# Patient Record
Sex: Male | Born: 1940 | Race: White | Hispanic: No | Marital: Married | Smoking: Former smoker
Health system: Southern US, Community
[De-identification: ages and names within clinical notes are randomized; demographics above are authoritative.]

## PROBLEM LIST (undated history)

## (undated) DIAGNOSIS — E785 Hyperlipidemia, unspecified: Secondary | ICD-10-CM

## (undated) DIAGNOSIS — M4800 Spinal stenosis, site unspecified: Secondary | ICD-10-CM

## (undated) DIAGNOSIS — I480 Paroxysmal atrial fibrillation: Secondary | ICD-10-CM

## (undated) DIAGNOSIS — N184 Chronic kidney disease, stage 4 (severe): Secondary | ICD-10-CM

## (undated) DIAGNOSIS — R7989 Other specified abnormal findings of blood chemistry: Secondary | ICD-10-CM

## (undated) DIAGNOSIS — I639 Cerebral infarction, unspecified: Secondary | ICD-10-CM

## (undated) DIAGNOSIS — Z7901 Long term (current) use of anticoagulants: Secondary | ICD-10-CM

## (undated) DIAGNOSIS — I10 Essential (primary) hypertension: Secondary | ICD-10-CM

## (undated) DIAGNOSIS — I2510 Atherosclerotic heart disease of native coronary artery without angina pectoris: Secondary | ICD-10-CM

## (undated) DIAGNOSIS — E7800 Pure hypercholesterolemia, unspecified: Secondary | ICD-10-CM

## (undated) HISTORY — DX: Hyperlipidemia, unspecified: E78.5

## (undated) HISTORY — PX: CHOLECYSTECTOMY: SHX55

## (undated) HISTORY — DX: Long term (current) use of anticoagulants: Z79.01

## (undated) HISTORY — PX: CATARACT EXTRACTION: SUR2

## (undated) HISTORY — DX: Pure hypercholesterolemia, unspecified: E78.00

## (undated) HISTORY — DX: Atherosclerotic heart disease of native coronary artery without angina pectoris: I25.10

## (undated) HISTORY — DX: Essential (primary) hypertension: I10

## (undated) HISTORY — DX: Other specified abnormal findings of blood chemistry: R79.89

## (undated) HISTORY — DX: Spinal stenosis, site unspecified: M48.00

## (undated) HISTORY — DX: Cerebral infarction, unspecified: I63.9

## (undated) HISTORY — DX: Chronic kidney disease, stage 4 (severe): N18.4

## (undated) HISTORY — DX: Paroxysmal atrial fibrillation: I48.0

## (undated) HISTORY — PX: BACK SURGERY: SHX140

---

## 1997-11-12 ENCOUNTER — Inpatient Hospital Stay (HOSPITAL_COMMUNITY): Admission: EM | Admit: 1997-11-12 | Discharge: 1997-11-20 | Payer: Self-pay | Admitting: Emergency Medicine

## 1998-01-21 ENCOUNTER — Ambulatory Visit (HOSPITAL_COMMUNITY): Admission: RE | Admit: 1998-01-21 | Discharge: 1998-01-21 | Payer: Self-pay | Admitting: Surgery

## 2005-09-14 ENCOUNTER — Encounter: Payer: Self-pay | Admitting: Surgery

## 2007-12-10 ENCOUNTER — Inpatient Hospital Stay (HOSPITAL_COMMUNITY): Admission: AD | Admit: 2007-12-10 | Discharge: 2007-12-13 | Payer: Self-pay | Admitting: Family Medicine

## 2007-12-11 ENCOUNTER — Ambulatory Visit: Payer: Self-pay | Admitting: Vascular Surgery

## 2007-12-11 ENCOUNTER — Encounter (INDEPENDENT_AMBULATORY_CARE_PROVIDER_SITE_OTHER): Payer: Self-pay | Admitting: Family Medicine

## 2007-12-12 ENCOUNTER — Ambulatory Visit: Payer: Self-pay | Admitting: Physical Medicine & Rehabilitation

## 2007-12-13 ENCOUNTER — Ambulatory Visit: Payer: Self-pay | Admitting: Oncology

## 2007-12-13 ENCOUNTER — Ambulatory Visit: Payer: Self-pay | Admitting: Physical Medicine & Rehabilitation

## 2007-12-17 ENCOUNTER — Inpatient Hospital Stay (HOSPITAL_COMMUNITY)
Admission: RE | Admit: 2007-12-17 | Discharge: 2007-12-21 | Payer: Self-pay | Admitting: Physical Medicine & Rehabilitation

## 2007-12-21 ENCOUNTER — Ambulatory Visit: Payer: Self-pay | Admitting: Hematology

## 2007-12-22 ENCOUNTER — Emergency Department (HOSPITAL_COMMUNITY): Admission: EM | Admit: 2007-12-22 | Discharge: 2007-12-22 | Payer: Self-pay | Admitting: Emergency Medicine

## 2007-12-25 ENCOUNTER — Encounter
Admission: RE | Admit: 2007-12-25 | Discharge: 2008-03-24 | Payer: Self-pay | Admitting: Physical Medicine & Rehabilitation

## 2008-02-22 ENCOUNTER — Encounter
Admission: RE | Admit: 2008-02-22 | Discharge: 2008-05-15 | Payer: Self-pay | Admitting: Physical Medicine & Rehabilitation

## 2008-02-25 ENCOUNTER — Ambulatory Visit: Payer: Self-pay | Admitting: Physical Medicine & Rehabilitation

## 2008-03-25 ENCOUNTER — Encounter
Admission: RE | Admit: 2008-03-25 | Discharge: 2008-04-28 | Payer: Self-pay | Admitting: Physical Medicine & Rehabilitation

## 2008-04-29 ENCOUNTER — Encounter
Admission: RE | Admit: 2008-04-29 | Discharge: 2008-05-15 | Payer: Self-pay | Admitting: Physical Medicine & Rehabilitation

## 2009-03-09 ENCOUNTER — Encounter: Admission: RE | Admit: 2009-03-09 | Discharge: 2009-03-09 | Payer: Self-pay | Admitting: Family Medicine

## 2009-07-01 ENCOUNTER — Observation Stay (HOSPITAL_COMMUNITY): Admission: RE | Admit: 2009-07-01 | Discharge: 2009-07-04 | Payer: Self-pay | Admitting: Specialist

## 2009-12-16 ENCOUNTER — Ambulatory Visit: Payer: Self-pay | Admitting: Cardiology

## 2009-12-30 ENCOUNTER — Ambulatory Visit: Payer: Self-pay | Admitting: Cardiology

## 2010-01-27 ENCOUNTER — Ambulatory Visit: Payer: Self-pay | Admitting: Cardiology

## 2010-02-17 ENCOUNTER — Ambulatory Visit: Payer: Self-pay | Admitting: Cardiology

## 2010-08-05 LAB — BASIC METABOLIC PANEL
BUN: 19 mg/dL (ref 6–23)
BUN: 20 mg/dL (ref 6–23)
BUN: 20 mg/dL (ref 6–23)
CO2: 23 mEq/L (ref 19–32)
Calcium: 8.1 mg/dL — ABNORMAL LOW (ref 8.4–10.5)
Chloride: 102 mEq/L (ref 96–112)
Chloride: 105 mEq/L (ref 96–112)
Chloride: 107 mEq/L (ref 96–112)
GFR calc Af Amer: 54 mL/min — ABNORMAL LOW (ref >60)
GFR calc non Af Amer: 43 mL/min — ABNORMAL LOW (ref >60)
GFR calc non Af Amer: 45 mL/min — ABNORMAL LOW (ref >60)
Potassium: 4.1 mEq/L (ref 3.5–5.1)
Potassium: 4.2 mEq/L (ref 3.5–5.1)
Sodium: 137 mEq/L (ref 135–145)
Sodium: 137 mEq/L (ref 135–145)
Sodium: 138 mEq/L (ref 135–145)

## 2010-08-05 LAB — COMPREHENSIVE METABOLIC PANEL
ALT: 17 U/L (ref 0–53)
AST: 18 U/L (ref 0–37)
Albumin: 3.6 g/dL (ref 3.5–5.2)
Albumin: 3.8 g/dL (ref 3.5–5.2)
BUN: 20 mg/dL (ref 6–23)
CO2: 25 mEq/L (ref 19–32)
Calcium: 8.8 mg/dL (ref 8.4–10.5)
Chloride: 106 mEq/L (ref 96–112)
Creatinine, Ser: 1.64 mg/dL — ABNORMAL HIGH (ref 0.4–1.5)
GFR calc Af Amer: 51 mL/min — ABNORMAL LOW (ref >60)
GFR calc non Af Amer: 42 mL/min — ABNORMAL LOW (ref >60)
Glucose, Bld: 89 mg/dL (ref 70–99)
Sodium: 141 mEq/L (ref 135–145)
Total Bilirubin: 1.3 mg/dL — ABNORMAL HIGH (ref 0.3–1.2)
Total Bilirubin: 1.3 mg/dL — ABNORMAL HIGH (ref 0.3–1.2)
Total Protein: 6.7 g/dL (ref 6.0–8.3)
Total Protein: 6.9 g/dL (ref 6.0–8.3)

## 2010-08-05 LAB — URINALYSIS, ROUTINE W REFLEX MICROSCOPIC
Glucose, UA: NEGATIVE mg/dL
Specific Gravity, Urine: 1.016 (ref 1.005–1.030)
pH: 6.5 (ref 5.0–8.0)

## 2010-08-05 LAB — CBC
HCT: 33.8 % — ABNORMAL LOW (ref 39.0–52.0)
HCT: 35.3 % — ABNORMAL LOW (ref 39.0–52.0)
Hemoglobin: 11.8 g/dL — ABNORMAL LOW (ref 13.0–17.0)
Hemoglobin: 13.1 g/dL (ref 13.0–17.0)
Hemoglobin: 16.2 g/dL (ref 13.0–17.0)
MCHC: 34 g/dL (ref 30.0–36.0)
MCHC: 34.7 g/dL (ref 30.0–36.0)
MCV: 94.6 fL (ref 78.0–100.0)
MCV: 95 fL (ref 78.0–100.0)
MCV: 95.1 fL (ref 78.0–100.0)
Platelets: 151 10*3/uL (ref 150–400)
RBC: 3.71 MIL/uL — ABNORMAL LOW (ref 4.22–5.81)
RBC: 3.98 MIL/uL — ABNORMAL LOW (ref 4.22–5.81)
RBC: 5.03 MIL/uL (ref 4.22–5.81)
RDW: 13.1 % (ref 11.5–15.5)
RDW: 13.3 % (ref 11.5–15.5)
RDW: 13.4 % (ref 11.5–15.5)
RDW: 13.5 % (ref 11.5–15.5)
WBC: 10.3 10*3/uL (ref 4.0–10.5)
WBC: 10.4 10*3/uL (ref 4.0–10.5)
WBC: 12.9 10*3/uL — ABNORMAL HIGH (ref 4.0–10.5)

## 2010-08-05 LAB — PROTIME-INR
INR: 1.24 (ref 0.00–1.49)
Prothrombin Time: 15.9 seconds — ABNORMAL HIGH (ref 11.6–15.2)

## 2010-08-13 ENCOUNTER — Ambulatory Visit: Payer: Self-pay | Admitting: Cardiology

## 2010-08-18 ENCOUNTER — Encounter: Payer: Self-pay | Admitting: *Deleted

## 2010-08-19 ENCOUNTER — Ambulatory Visit (INDEPENDENT_AMBULATORY_CARE_PROVIDER_SITE_OTHER): Payer: Medicare Other | Admitting: Nurse Practitioner

## 2010-08-19 ENCOUNTER — Encounter: Payer: Self-pay | Admitting: Nurse Practitioner

## 2010-08-19 VITALS — BP 150/100 | HR 56 | Ht 69.0 in | Wt 208.2 lb

## 2010-08-19 DIAGNOSIS — E785 Hyperlipidemia, unspecified: Secondary | ICD-10-CM | POA: Insufficient documentation

## 2010-08-19 DIAGNOSIS — Z7901 Long term (current) use of anticoagulants: Secondary | ICD-10-CM | POA: Insufficient documentation

## 2010-08-19 DIAGNOSIS — I4891 Unspecified atrial fibrillation: Secondary | ICD-10-CM

## 2010-08-19 DIAGNOSIS — I10 Essential (primary) hypertension: Secondary | ICD-10-CM | POA: Insufficient documentation

## 2010-08-19 DIAGNOSIS — I480 Paroxysmal atrial fibrillation: Secondary | ICD-10-CM | POA: Insufficient documentation

## 2010-08-19 MED ORDER — AMLODIPINE BESYLATE 10 MG PO TABS: 10.0000 mg | ORAL_TABLET | Freq: Every day | ORAL | Status: DC

## 2010-08-19 NOTE — Progress Notes (Signed)
History of Present Illness: [name] is seen today for a follow up visit. He is seen for Dr. Martinique. It is his 6 month check. Overall, he has been doing ok. He tries to stay active. He checks his blood pressure at home. It has been running in the [id number] to [id number] systolic range at home. He denies chest pain, shortness of breath, dizziness or palpitations. He is tolerating his medicines. Recent labs are reviewed. Blood pressure is up here today. He has already had his medicines today.  Current Outpatient Prescriptions on File Prior to Visit  Medication Sig Dispense Refill  . Ascorbic Acid (VITAMIN C) 500 MG tablet Take 1 tablet (500 mg total) by mouth daily.  30 tablet    . Coenzyme Q10 (COQ10) 200 MG CAPS Take by mouth daily.      . dabigatran (PRADAXA) 150 MG CAPS Take 150 mg by mouth every 12 (twelve) hours.        Marland Kitchen FA-Pyridoxine-Cyancobalamin (FOLTX PO) Take by mouth daily.        . hydrochlorothiazide (,MICROZIDE/HYDRODIURIL,) 12.5 MG capsule Take 1 capsule (12.5 mg total) by mouth daily.  30 capsule    . lisinopril (PRINIVIL,ZESTRIL) 20 MG tablet Take 20 mg by mouth daily.        . simvastatin (ZOCOR) 20 MG tablet Take 20 mg by mouth at bedtime.        Marland Kitchen DISCONTD: amLODipine (NORVASC) 5 MG tablet Take 5 mg by mouth daily.        Marland Kitchen aspirin 81 MG tablet Take 81 mg by mouth daily.          No Known Allergies  Past Medical History  Diagnosis Date  . Arrhythmia     AFIB  . Hypertension   . Stroke   . Anticoagulated on warfarin   . Hyperlipidemia     No past surgical history on file.  History  Smoking status  . Former Smoker  . Quit date: 05/16/1968  Smokeless tobacco  . Not on file    History  Alcohol Use No    Family History  Problem Relation Age of Onset  . Hypertension Mother   . Heart disease Father     Review of Systems: The review of systems is positive for chronic tremor of the left hand. He denies any stroke symptoms. He does have some arthritic complaint.  All  other systems were reviewed and are negative.  Physical Exam: BP 150/100  Pulse 56  Ht 5\' 9"  (1.753 m)  Wt 208 lb 3.2 oz (94.439 kg)  BMI 30.75 kg/m2 Patient is very pleasant and in no acute distress. Skin is warm and dry. Color is normal.  HEENT is unremarkable. Normocephalic/atraumatic. PERRL. Sclera are nonicteric. Neck is supple. No masses. No JVD. Lungs are clear. Cardiac exam shows an irregular irregular rhythm. Abdomen is soft. Extremities are without edema. Gait and ROM are intact. No gross neurologic deficits noted. He does have a tremor of the left hand noted.   LABORATORY DATA:   BUN was 27 and creatinine 1.6. His total cholesterol  Is 95, LDL is 50, HDL was 34, TG 57. Blood sugar, LFTs, and CBC is normal.  Assessment / Plan:

## 2010-08-19 NOTE — Patient Instructions (Signed)
Monitor your blood pressure at home.  Record your readings and bring to your next visit. Limit sodium intake. We are going to increase the amlodipine to 10mg  each day. Take two of your 5 mg tablets to equal this dose. I have sent the prescription for the 10mg  to the drug store. I will see you in one month.

## 2010-08-19 NOTE — Assessment & Plan Note (Signed)
Rate is controlled. He is on Pradaxa.

## 2010-08-19 NOTE — Assessment & Plan Note (Signed)
On low dose Zocor. Lipids are satisfactory. He will need to have labs rechecked in about 6 months.

## 2010-08-19 NOTE — Assessment & Plan Note (Signed)
Blood pressure is elevated and here and at home. I am going to increase his Norvasc to 10mg . He is instructed to watch for swelling. I will see him back in one month. He is to continue to monitor at home.

## 2010-08-26 ENCOUNTER — Other Ambulatory Visit: Payer: Self-pay | Admitting: Cardiology

## 2010-08-26 DIAGNOSIS — I10 Essential (primary) hypertension: Secondary | ICD-10-CM

## 2010-08-26 MED ORDER — AMLODIPINE BESYLATE 10 MG PO TABS: 10.0000 mg | ORAL_TABLET | Freq: Every day | ORAL | Status: DC

## 2010-08-26 MED ORDER — HYDROCHLOROTHIAZIDE 12.5 MG PO CAPS: 12.5000 mg | ORAL_CAPSULE | Freq: Every day | ORAL | Status: DC

## 2010-08-26 MED ORDER — LISINOPRIL 20 MG PO TABS: 20.0000 mg | ORAL_TABLET | Freq: Every day | ORAL | Status: DC

## 2010-08-26 NOTE — Telephone Encounter (Signed)
Needs his blood pressure medication (Lisinopril, Amlodipinebesylate) refilled and needs a medication update. [facility] [place]. Please call back. I have pulled the chart.

## 2010-08-26 NOTE — Telephone Encounter (Signed)
Wife called requesting refill on 3 meds;sent to [facility]

## 2010-09-17 ENCOUNTER — Ambulatory Visit (INDEPENDENT_AMBULATORY_CARE_PROVIDER_SITE_OTHER): Payer: Medicare Other | Admitting: Nurse Practitioner

## 2010-09-17 ENCOUNTER — Encounter: Payer: Self-pay | Admitting: Nurse Practitioner

## 2010-09-17 VITALS — BP 126/80 | HR 58 | Ht 70.0 in | Wt 210.0 lb

## 2010-09-17 DIAGNOSIS — I10 Essential (primary) hypertension: Secondary | ICD-10-CM

## 2010-09-17 DIAGNOSIS — Z7901 Long term (current) use of anticoagulants: Secondary | ICD-10-CM

## 2010-09-17 DIAGNOSIS — I4891 Unspecified atrial fibrillation: Secondary | ICD-10-CM

## 2010-09-17 DIAGNOSIS — I480 Paroxysmal atrial fibrillation: Secondary | ICD-10-CM

## 2010-09-17 MED ORDER — DABIGATRAN ETEXILATE MESYLATE 150 MG PO CAPS: 150.0000 mg | ORAL_CAPSULE | Freq: Two times a day (BID) | ORAL | Status: DC

## 2010-09-17 NOTE — Patient Instructions (Signed)
Stay on your current medicines. Continue to watch your salt.  If your blood pressure stays below 120 on the top, you can try cutting back the Norvasc to just a half a tablet again. We will see you back in about 6 months. Call for any problems.

## 2010-09-17 NOTE — Assessment & Plan Note (Signed)
He is in atrial fib by exam today. He is asymptomatic. He remains on anticoagulation.

## 2010-09-17 NOTE — Progress Notes (Signed)
   Antonio [name] Date of Birth: [birth year]   History of Present Illness: Antonio [name] is seen back today for a one month visit. He is seen for Dr. Martinique. He is on higher doses of Norvasc for his blood pressure. He is back checking his blood pressure and it is better at home. His readings from home are reviewed. He had had a higher intake of salt on the day of his last visit and he is trying to do better. He does have some occasional edema of his ankles but he says it is not bothersome. He has no chest pain. He has no complaint.  Current Outpatient Prescriptions on File Prior to Visit  Medication Sig Dispense Refill  . amLODipine (NORVASC) 10 MG tablet Take 1 tablet (10 mg total) by mouth daily.  30 tablet  11  . Ascorbic Acid (VITAMIN C) 500 MG tablet Take 1 tablet (500 mg total) by mouth daily.  30 tablet    . Coenzyme Q10 (COQ10) 200 MG CAPS Take by mouth daily.      . dabigatran (PRADAXA) 150 MG CAPS Take 150 mg by mouth every 12 (twelve) hours.        Marland Kitchen FA-Pyridoxine-Cyancobalamin (FOLTX PO) Take by mouth daily.        . hydrochlorothiazide (,MICROZIDE/HYDRODIURIL,) 12.5 MG capsule Take 1 capsule (12.5 mg total) by mouth daily.  30 capsule  5  . lisinopril (PRINIVIL,ZESTRIL) 20 MG tablet Take 1 tablet (20 mg total) by mouth daily.  30 tablet  5  . simvastatin (ZOCOR) 20 MG tablet Take 20 mg by mouth at bedtime.        Marland Kitchen aspirin 81 MG tablet Take 81 mg by mouth daily.          No Known Allergies  Past Medical History  Diagnosis Date  . Hypertension   . Stroke   . Anticoagulated on warfarin     Replaced with Pradaxa  . Hyperlipidemia   . Chronic anticoagulation   . CRI (chronic renal insufficiency)   . PAF (paroxysmal atrial fibrillation)     Past Surgical History  Procedure Date  . Cholecystectomy   . Cataract extraction   . Back surgery     History  Smoking status  . Former Smoker  . Quit date: 05/16/1968  Smokeless tobacco  . Not on file    History  Alcohol Use  No    Family History  Problem Relation Age of Onset  . Hypertension Mother   . Stroke Mother   . Heart disease Father   . Diabetes Father   . Diabetes Brother   . Diabetes Brother     Review of Systems: The review of systems is positive for occasional edema of the ankles since starting the higher dose of Norvas.  All other systems were reviewed and are negative.  Physical Exam: BP 126/80  Pulse 58  Ht 5\' 10"  (1.778 m)  Wt 210 lb (95.255 kg)  BMI 30.13 kg/m2 Patient is very pleasant and in no acute distress. Skin is warm and dry. Color is normal.  HEENT is unremarkable. Normocephalic/atraumatic. PERRL. Sclera are nonicteric. Neck is supple. No masses. No JVD. Lungs are clear. Cardiac exam shows an irregular rhythm today. Abdomen is soft. Extremities are with just trace edema. Gait and ROM are intact. No gross neurologic deficits noted.  LABORATORY DATA:   Assessment / Plan:

## 2010-09-17 NOTE — Assessment & Plan Note (Signed)
He remains on Pradaxa. He is not having any adverse effects. Prescription was refilled today. Samples were also given.

## 2010-09-17 NOTE — Assessment & Plan Note (Signed)
Blood pressure is better. He seems committed to watching his salt. He will continue to monitor his blood pressure at home. I will let him cut back on the Norvasc back to 5mg  if his systolic readings stay below 120 and he can watch his salt. Patient is agreeable to this plan and will call if any problems develop in the interim.

## 2010-09-28 NOTE — Consult Note (Signed)
NAME:  Antonio [name], Antonio [name] NO.:  [id number]   MEDICAL RECORD NO.:  [id number]          PATIENT TYPE:  IPS   LOCATION:  [id number]                         FACILITY:  [facility]   PHYSICIAN:  Sherryl Manges, MD            DATE OF BIRTH:  07/31/1940   DATE OF CONSULTATION:  12/17/2007  DATE OF DISCHARGE:                                 CONSULTATION   REASON FOR CONSULTATION:  Rule out polycythemia vera as a result of his  CVA.   HISTORY OF PRESENT ILLNESS:  Antonio [name] is a 70 year old right-handed  man with history of hypertension, who was admitted on December 10, 2007, for  newly diagnosed CVA.  He had a history of hypertension for many years,  however, he never had a history of stroke.  On the day of admission, he  was noted to have right sided weakness in his right arm and leg in  addition to clumsiness.  Of note, he is a right-handed gentleman.  Eventually, he developed expressive aphasia.  He presented to his PCP's  office, where he was noted to have pressure of 210/110, with atrial  fibrillation.  He was eventually admitted to the hospital where an MRI  of the brain and MRA was performed on December 11, 2007, which showed acute  infarct in the posterior left corona radiata extending to the posterior  limb of the internal capsule.  There was no mass effect or hemorrhage.  There was superimposed advanced chronic small vessel ischemic disease.  The MRA showed intracranial atherosclerotic disease, worse involving the  left supraclinoid ICA, left MCA, left vertebral artery and right PCA  without high grade stenosis or branch occlusions identified.  He was  discharged to inpatient rehab and has remarkably regained his right  sided strength in addition to his aphasia.  The patient was visited  today for the today for the time in the presence of his wife and his  daughter.  He denied fever, chills, headache, visual change,  erythromelalgia, neuropathy, nodal swelling, chest pain, shortness of  breath, calf swelling, calf pain, bone pain, abdominal swelling or  breathing symptoms.   The rest of the 14 point review of systems was negative.   PAST MEDICAL HISTORY:  1. Hypertension.  2. The patient denied a history of COPD, however, he does have an      extensive history of second-hand smoke.   CURRENT MEDICATIONS:  Lisinopril, Zocor, vitamin B12, folate, Coumadin,  hydrochlorothiazide, aspirin, Norvasc, Catapres, Dulcolax, senna,  trazodone, Tylenol p.r.n., Phenergan.   ALLERGIES:  NO KNOWN DRUG ALLERGIES.   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  He did have a personal  history of smoking, however, he had quit 40 years ago.  He denies  history of alcohol or IV drug use.  He lives with his wife.   FAMILY HISTORY:  His father is alive at age 60.  However, the father did  have history of prostate, bladder and colon cancer and apparently is  in  remission from these cancers.  His mom has hypertension.  He has two  brothers and one sister.  They have diabetes.  One of the brothers has  prostate cancer.   PHYSICAL EXAMINATION:  VITAL SIGNS:  Temperature 97.3 Fahrenheit, heart  rate 61, respiratory rate 18, blood pressure 131/70, 95% on room air.  GENERAL:  No acute distress.  Alert and oriented x4.  HEENT:  Oropharynx was clear without mucosal lesion.  LYMPHATIC:  Without cervical or axillary lymphadenopathy.  LUNGS:  Clear bilaterally without wheezes or crackles.  CARDIOVASCULAR:  Irregularly irregular.  S1-S2 without murmur, rub or  gallop.  ABDOMEN:  Soft, flat, nontender.  Nondistended without fluid wave or  organomegaly.  EXTREMITIES:  There was no pedal edema.  NEURO:  II-XII grossly intact, except for tongue deviated to the left.  There was a right pronator drift.  Motor strength is 5/5 on the left  side, however, 4+/5 on the right side, both upper and lower extremities.  There was no  dysmetria.  There was hyperreflexia on the right side.   LABORATORY DATA:  WBC 8.3, hemoglobin 18.1, MCV 92, platelet count 187.  Potassium 3.3, BUN 24, creatinine 1.37, AST 23, ALT 19, alkaline  phosphatase 80, T bili 0.9, albumin 3.4, INR 2.2.  Vitamin B12  182  picogram per mL.  TSH  3.6.  Homocysteine level  21.4.   ASSESSMENT/PLAN:  It is my impression that Antonio [name] is a 70 year old  man with history of hypertension, who was admitted for right sided  weakness with MRI showing a left sided cerebrovascular accident, who was  incidentally found to have an elevated hemoglobin.   For his elevated hemoglobin, need to determine that this is truly  polycythemia with nuclear study red cell mass.  Since the patient is on  hydrochlorothiazide, in addition to second-hand smoke, his elevated  hemoglobin can be secondary to diuretics and elevated carboxyhemoglobin.  Would also recommend arterial blood gas for carboxy-hemoglobin level.  In addition, will recommend testing his serum level for iron study and  erythropoietin in addition to methylmalonic acid to truly determine that  he does has vitamin B12 deficiency.  Will also send his peripheral blood  for JAK-2 gene mutation as 90 % of patients with polycythemia vera is  positive for this mutation.   Recommend following the patient's potassium level as vitamin B12  replacement in patients with B12 deficiency can result in hypokalemia.  Otherwise, for his present CVA, will recommend continuing with aspirin,  Coumadin, and blood pressure control as you are right now.   Once all these tests are resulted, I will discuss with the patient the  final recommendation and treatment plan.  If patient is discharge prior  to the final result of these tests, please have patient follow up with  me in [facility] upon discharge.      Sherryl Manges, MD  Electronically Signed     HH/MEDQ  D:  12/17/2007  T:  12/17/2007  Job:  [id number]

## 2010-09-28 NOTE — Assessment & Plan Note (Signed)
[name] returns to clinic today accompanied by his wife and his  daughter.  The patient is a 70 year old male with a history of gallstone  pancreatitis in the past.  Otherwise, he was in relatively good health.  He was admitted from his primary care physician's office on December 10, 2007, with elevated blood pressure of 210/110 with atrial fibrillation  as well as right-sided weakness and slurred speech.  MRI and MRA studies  of the brain showed an acute left corona radiata to the posterior limb  and internal capsule infarct with advanced small vessel disease along  with intracranial stenosis without high-grade stenosis.  A 2D  echocardiogram showed an ejection fraction of 60-75% with mild-to-  moderate increase in left ventricular wall size.  Carotid Doppler showed  no significant internal carotid artery stenosis.  He was maintained on  Coumadin and aspirin at the time of transfer to the rehabilitation unit  on December 13, 2007.  While in the rehabilitation unit, the patient made  excellent progress overall.  He was eventually discharged home on December 21, 2007, and has been attending outpatient at physical therapy and  occupational therapy at the [facility] address.  Occupational therapy  is finished and he is independent with bathing and dressing.  He is  using a cane for longer distance mobility and physical therapy plans to  see him in another month or so.  He continues to see Dr. Verlon Setting, his  primary care physician.  The patient reports that he can walk  approximately 200 yards without any device at present.   MEDICATIONS:  1. Lisinopril 20 mg.  2. Hydrochlorothiazide 25 mg.  3. Foltx 1 tablet b.i.d.  4. Potassium chloride 20 mEq 2 tablets daily.  5. Simvastatin 20 mg.  6. Warfarin 2 mg daily except one-half tablet every Tuesday and      Thursday.  7. Ecotrin 81 mg daily.   REVIEW OF SYSTEMS:  Noncontributory.   PHYSICAL EXAMINATION:  GENERAL:  Well-appearing elderly adult  male in  mild-to-no acute discomfort.  VITAL SIGNS:  Blood pressure 156/72 with pulse 70, respiratory rate 18,  and O2 saturation 98% on room air.  His wife shows records of blood  pressure readings that have been essentially in the 120-140 range  consistently since the hydrochlorothiazide was increased to 25 mg daily.  He has 4+/5 strength throughout the bilateral upper extremities and 4/5  strength throughout the bilateral lower extremities.  He ambulates  without any assisted device.  Sensation was intact to light touch  throughout the bilateral upper and lower extremities.   IMPRESSION:  1. Status post left hemisphere stroke with right-sided weakness.  2. Hypertension.  3. Rate-controlled atrial fibrillation.  4. History of dyslipidemia.   At the present time, the patient continues on Coumadin for treatment of  atrial fibrillation and embolism prophylaxis.  He also continues on  Zocor and a combination of hydrochlorothiazide and lisinopril for blood  pressure control.  We will plan on seeing the patient in followup on an  as needed basis.  He will be released to Dr. Verlon Setting as he will likely  finish the therapy within the next couple of weeks.  He is doing well  overall.  He had a good return from his recent stroke.           ______________________________  Jarvis Morgan, M.D.     DC/MedQ  D:  02/25/2008 13:20:58  T:  02/26/2008 02:56:23  Job #:  [id number]

## 2010-09-28 NOTE — Discharge Summary (Signed)
NAME:  DAIMEON, WEISINGER NO.:  [id number]   MEDICAL RECORD NO.:  [id number]          PATIENT TYPE:  IPS   LOCATION:  [id number]                         FACILITY:  [facility]   PHYSICIAN:  Adele Barthel, MD    DATE OF BIRTH:  July 01, 1940   DATE OF ADMISSION:  12/13/2007  DATE OF DISCHARGE:                               DISCHARGE SUMMARY   ADDENDUM   DISCHARGE MEDICATIONS:  Famotidine 20 mg p.o. q.12 h.      Adele Barthel, MD  Electronically Signed     NP/MEDQ  D:  12/13/2007  T:  12/14/2007  Job:  [id number]   cc:   Dr. Redmond Pulling  Dr. Katrine Coho

## 2010-09-28 NOTE — Discharge Summary (Signed)
NAME:  Antonio [name], Antonio [name] NO.:  [id number]   MEDICAL RECORD NO.:  [id number]          PATIENT TYPE:  IPS   LOCATION:  4031                         FACILITY:  [facility]   PHYSICIAN:  Jarvis Morgan, M.D.   DATE OF BIRTH:  [birth year]   DATE OF ADMISSION:  12/13/2007  DATE OF DISCHARGE:                               DISCHARGE SUMMARY   PRIMARY CARE PHYSICIAN:  Jama Flavors. Redmond Pulling, MD.   HOSPITALIST:  Barbette Merino, MD   HISTORY OF PRESENT ILLNESS:  Mr. Jahnke is a 70 year old Caucasian male  with history of gallstone and pancreatitis with prior cholecystectomy.  He was otherwise in good health, although he did not see physicians on a  regular basis.   The patient was admitted from his MD office December 10, 2007, with elevated  blood pressure of 210/110 with atrial fibrillation and right-sided  weakness with slurred speech.  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  A 2-D echocardiogram showed an ejection fraction  of 60-75% with mild to moderate increase in left ventricular wall  diameter.  Carotid Dopplers showed no significant internal carotid  artery stenosis.  The patient continues with right-sided weakness with  mild dysarthria and right facial weakness.  He is placed on aspirin for  stroke prophylaxis.   The patient was evaluated by the rehabilitation physicians and felt to  be an appropriate candidate for inpatient rehabilitation.   REVIEW OF SYSTEMS:  Positive for back pain periodically.   PAST MEDICAL HISTORY:  1. History of gallstone and pancreatitis with prior cholecystectomy.  2. Right lower extremity sciatica.   FAMILY HISTORY:  Positive for diabetes mellitus, type 2.   SOCIAL HISTORY:  The patient is married and lives with his wife in a 1-  level home with 3 steps to enter.  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  The patient does not smoke  tobacco, but does chew tobacco approximately 1/2-1 pouch per day.  He  does not use alcohol.  He previously worked as a Psychologist, sport and exercise and has helped  at [employer] for numerous years.   FUNCTIONAL HISTORY PRIOR TO ADMISSION:  Independent and working prior  admission.   ALLERGIES:  No known drug allergies.   MEDICATIONS PRIOR ADMISSION:  None.   LABORATORY:  Recent hemoglobin was 17.8 with hematocrit of 53.5,  platelet count of 194,000, and white count of 11.8.  Recent B12 was 182.  Hemoglobin A1c was normal at 5.2 and homocystine level was 21.6.  Recent  total cholesterol was 123, HDL cholesterol of 25, LDL cholesterol of 86,  and triglycerides of 81.  TSH was normal at 3.638.  Recent sodium was  143, potassium 3.1, chloride 110, bicarbonate 26, BUN 10, creatinine  1.18, and glucose of 77 with total bili elevated at 1.6 and direct bili  of 1.3 with AST of 19, ALT of 21, and alkaline phosphatase of 52.  PHYSICAL EXAMINATION:  GENERAL:  A well-appearing adult male, lying in  bed, in no acute discomfort.  VITAL SIGNS:  Blood pressure 177/100 with a pulse of 54, pulse rate 15,  and temperature 98.1.  HEENT:  Normocephalic and nontraumatic.  CARDIOVASCULAR:  Regular rate and rhythm.  S1 and S2 without murmurs.  ABDOMEN:  Soft, slightly obese, and nontender with positive bowel  sounds.  LUNGS:  Clear to auscultation bilaterally.  NEUROLOGIC:  Alert and oriented x3.  Cranial nerves II-XII showed facial  weakness on the right with facial droop during smiling.  Extraocular  muscles were intact and eye closure was good.  Tongue was in the  midline.  Sensation was decreased to light touch in the right face  compared to the left.  Left upper and left lower extremity strength were  5/5 throughout.  Bulk and tone were normal.  Reflexes were 2+ and  symmetrical.  Sensation was intact to light touch  throughout the left  upper and left lower extremity.  Right upper extremity strength was 4+/5  with slightly decreased coordinated movements and decreased sensation.  Right lower extremity strength was 4-4+/5 with decreased coordinated  movements and decreased sensation.   DIAGNOSES:  1. 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  2. Malignant hypertension.  3. Hyperhomocysteinemia with Foltx added.  4. Mild dyslipidemia with HDL of only 25.  5. Atrial fibrillation with ongoing medication for rate control and      monitoring as needed.  6. Elevated total bilirubin and direct bilirubin with repeat labs due.   Presently, the patient has deficits in ADLs, transfers, and ambulation  along with higher-level cognition and speech related to the above-noted  left corona radiata infarct.   PLAN:  1. Admit to the rehabilitation unit for daily therapies, which include      physical therapy for range of motion, strengthening, bed mobility,      transfers, pre-gait training, gait training, and equipment      evaluation.  2. Occupational therapy for range of motion, strengthening, ADLs,      cognitive/perceptual training, splinting, and equipment evaluation.  3. Rehab nursing for skin care, wound care, and bowel and bladder      training as necessary.  4. Speech therapy for oral motor exercise, evaluation of swallow, and      use of VitalStim as necessary.  5. Case management to assess home environment, assist with discharge      planning, and arrange for appropriate followup care.  6. Social work to assess family social support and assist in discharge      planning.  7. Continue regular diet.  8. Check admission labs including CBC and CMET in the a.m. on December 14, 2007.  9. Tylenol 325 mg 1-2 tablets p.o. q.4 h. p.r.n. for pain.  10.Lisinopril 20 mg p.o. daily.  11.Enteric-coated aspirin 325 mg p.o.  daily.  12.Zocor 20 mg p.o. nightly.  13.Catapres 0.1 mg p.o. q.6 h. p.r.n. for systolic blood pressure      greater than or equal to 99991111 or diastolic blood pressure greater      than or equal to 95.  14.Foltx 1 p.o. daily.  15.Grounds pass with staff and family p.r.n.  16.Lovenox 100 mg subcu q.12 h. for DVT prophylaxis.  17.Norvasc 5 mg p.o. daily.  18.Saline eye drops 1 drop into each eye b.i.d.  and p.r.n.   Presently, the plan is to start aggressive physical, occupational, and  speech therapy on a daily basis, 3 hours per day along with 24-hour  nursing for advancement of his ADLs, transfers, ambulation, and higher-  level cognition and speech.  We will have weekly conference meetings to  address status, goals, and barriers to discharge and we will include  family meetings as necessary.   PROGNOSIS:  Good.   ESTIMATED LENGTH OF STAY:  7-18 days.   GOALS:  Modified independent, ADLs, transfers, and ambulation.           ______________________________  Jarvis Morgan, M.D.     DC/MEDQ  D:  12/13/2007  T:  12/14/2007  Job:  [id number]

## 2010-09-28 NOTE — H&P (Signed)
NAME:  HOSAM, CABRERO NO.:  [id number]   MEDICAL RECORD NO.:  [id number]          PATIENT TYPE:  INP   LOCATION:  [id number]                         FACILITY:  [facility]   PHYSICIAN:  Barbette Merino, M.D.      DATE OF BIRTH:  02-Jan-1941   DATE OF ADMISSION:  12/10/2007  DATE OF DISCHARGE:                              HISTORY & PHYSICAL   PRIMARY CARE PHYSICIAN:  Jama Flavors. Redmond Pulling, M.D.   PRESENTING COMPLAINT:  Right-sided weakness and slurred speech and also  uncontrolled blood pressure.   HISTORY OF PRESENT ILLNESS:  The patient is a 70 year old gentleman who  has not seen a doctor between 6 and 7 years.  He has apparently been  doing okay, very active until this morning, when he woke up and was  found to be having poor coordination.  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  He dropped his coffee mug and was very much  struggling to do his normal chores.  The patient was taken to his  primary care physician's office, at which point he was found to have  blood pressure of 210/110.  Thereafter per wife, he started having  slurred speech, although his weakness has literally disappeared.  He was  started on lisinopril today, but blood pressure did not improve  significantly, and the patient continued to worsen.  He was  subsequently brought into the hospital for further management.  At Dr.  [name] office, the patient was found to be in AFib.  He was also found  to have some elements of vertigo and hyperglycemia.  He was very  diaphoretic at that time in the doctor's office and very dizzy.  His  heart rate was found to be very irregular summation and was seen as new-  onset AFib.  The patient is currently able to speak, but he has what  definitely appeared to be a slurred speech.  Denied any chest pain.  No  cough.  No fever.  No nausea, vomiting, or diarrhea.   PAST MEDICAL HISTORY:  Significant for surgery when he was young, per  family.  Otherwise, no  significant history lately.   ALLERGIES:  He has no known drug allergies.   MEDICATIONS:  Only lisinopril, started today.   SOCIAL HISTORY:  The patient is married.  Lives in [place] with his  wife.  He quit smoking about 45 years ago.  Occasional alcohol.  No IV  drug use.   FAMILY HISTORY:  Significant for hypertension and diabetes.   REVIEW OF SYSTEMS:  A 12-point review of systems is negative except per  HPI.   PHYSICAL EXAMINATION:  VITAL SIGNS:  The patient's temperature is 98.9,  blood pressure 188/117, pulse 67, and respiratory rate 18.  His sats 98%  on room air.  GENERAL:  He is awake, alert, oriented, in no acute distress.  Conversant.  HEENT:  PERRL.  EOMI.  NECK:  Supple.  No JVD, no lymphadenopathy.  RESPIRATORY:  He has good air entry bilaterally.  No wheezes or rales.  CARDIOVASCULAR:  The patient  has irregularly irregular rhythm.  ABDOMEN:  Soft, nontender with positive bowel sounds.  EXTREMITIES:  No edema, cyanosis, or clubbing.  NEUROLOGICAL:  Showed cranial nerves.  The patient has left facial  droop, otherwise, the rest of his cranial nerves seem to be intact.  His  speech is slurred.  Power, he has 5/5 upper and lower extremities  bilaterally respectively.  Reflexes 2+.   Labs are currently pending.  EKG is currently pending.   ASSESSMENT:  This is a 70 year old gentleman presenting with what  appeared to be new-onset atrial fibrillation, right-sided weakness, and  slurred speech.  His right-sided weakness seem to be transient, as this  has disappeared now.  So, the patient must have a transient ischemic  attack or may be hypertensive emergency or urgency responsible for his  weakness and slurred speech.  The patient also as well appeared to be in  atrial fibrillation, but the rate seems to be controlled at this point.  He does not know his cholesterol status, as he did not see a doctor in 7  years.   PLAN:  1. Possible TIA.  We will work the  patient also for possible TIA.  We      will get a STAT head CT.  Get a swallow evaluation bedside right      now.  Get homocysteine level, EKG, and 2D echo.  Check TSH, RPR,      and B12.  If the patient's CT is negative, we will still get an MRI      and MRA of the brain to further characterize his lesion and rule      out CVA.  2. Uncontrolled hypertension.  We aim to drop his systolic blood      pressure between 160 and 99991111 and diastolic between 90 to 95.  This      has been in lieu of the fact that the patient may have had a CVA,      so we do not want to crash his blood pressure precipitously.  The      patient's heart rate seemed to be low.  Really, he has AFib, so we      will avoid rate-lowering drugs, such as Cardizem or beta-blocker.  3. Unknown cholesterol status.  We will check his fasting lipid panel      as part of our current workup.  Otherwise, further treatment      management will depend on how the patient does in the hospital.      Barbette Merino, M.D.  Electronically Signed     LG/MEDQ  D:  12/10/2007  T:  12/11/2007  Job:  [id number]

## 2010-09-28 NOTE — Discharge Summary (Signed)
NAME:  Antonio [name], Antonio [name] NO.:  [id number]   MEDICAL RECORD NO.:  [id number]          PATIENT TYPE:  IPS   LOCATION:  [id number]                         FACILITY:  [facility]   PHYSICIAN:  Jarvis Morgan, M.D.   DATE OF BIRTH:  [birth year]   DATE OF ADMISSION:  12/13/2007  DATE OF DISCHARGE:  12/21/2007                               DISCHARGE SUMMARY   DISCHARGE DIAGNOSES:  1. Left cerebrovascular accident with right hemiparesis.  2. Hypertension.  3. Rate control atrial fibrillation.  4. History of dyslipidemia.   HISTORY OF PRESENT ILLNESS:  Antonio [name] is a 70 year old male with  history of gallstone pancreatitis in the past, otherwise in relatively  good health, admitted from MD's office on December 10, 2007, with elevated  blood pressure of 210/110, AFib, as well as right-sided weakness with  slurred speech.  MRI and MRA of brain showed acute left corona radiata  to posterior limb, internal capsule infarct, advanced small vessel  disease, and intracranial atherosclerosis without high-grade stenosis.  A 2-D echo showed EF of 60-75% with mild-to-moderate increase in left  ventricular wall.  Carotid Doppler showed no ICA stenosis.  Currently,  the patient continues with right hemiparesis and mild dysarthria, no  dysphasia.  He was maintained on aspirin for CVA prophylaxis with  recommendations to follow up with Cardiology, past discharge.  Coumadin  was initiated for CVA prophylaxis.   PAST MEDICAL HISTORY:  Significant for gallstone pancreatitis with  cholecystectomy and history of right lower extremity sciatica.   Allergies are no known drug allergies.   Family history is positive for diabetes.   SOCIAL HISTORY:  The patient is married, lives in one-level home with 1  plus 1 step at entry, has run a Environmental consultant, is a retired Psychologist, sport and exercise.  He chews half to one pack of tobacco.  Does not use any alcohol.   FUNCTIONAL HISTORY:  The patient was independent and working prior  to  admission, did not use an assistive device, was driving.   FUNCTIONAL STATUS:  Currently, the patient is min assist for ADLs and  self care.   PHYSICAL EXAM:  GENERAL:  At time of admission revealed a well-  nourished, well-developed male with right facial droop and mild  dysarthria.  HEENT:  Normocephalic and atraumatic with extraocular movements intact.  Speech showed poor dentition with caries teeth.  Oropharynx is normal.  Right facial droop noted.  NECK:  Supple without masses.  LUNGS:  Clear to auscultation bilaterally without wheezes.  HEART:  Occasional irregular beats.  No murmurs.  ABDOMEN:  Soft and nontender with positive bowel sounds.  EXTREMITIES:  No evidence of edema or cyanosis.  SKIN:  Intact without evidence of breakdown.  NEUROLOGIC:  The patient is alert and oriented x3 with a mild  dysarthria, right hemiparesis proximally greater than distally.  Question impairment of sensation right hand, right facial weakness.  Able to follow one and two-step command without difficulty.   HOSPITAL COURSE:  Antonio [name] was admitted to rehab on December 14, 2007, for inpatient therapies to consist of PT/OT and speech  therapy at  least 3 hours a day for 5 days a week.  Past admission, he was  maintained on subcutaneous Lovenox until Coumadin at therapeutic basis.  His blood pressures were monitored on b.i.d. basis and blood pressure  meds were adjusted for tighter control.  P.r.n. clonidine was initially  used for elevated blood pressures.  By the time of discharge, blood  pressures are better controlled ranging from Q000111Q systolic and Q000111Q  to [id number] diastolic on Norvasc, hydrochlorothiazide, and Zestril.  A CBC  checked at admission showed elevated blood counts with H&H at 19.0 and  55.7.  Check of lytes revealed sodium 135, potassium 3.7, chloride 102,  CO2 25, BUN 17, and creatinine 1.06.  LFTs revealed SGOT 23, SGPT 19,  alkaline phos 8.0, T-bili 0.9, total protein  6.0, and albumin 3.4.  A  recheck CBC was done to check accuracy of his H&H.  CBC on December 17, 2007, revealed H&H at 18.1 and 53.2 with white count 8.2, and platelets  187.  Check of lytes showed a mild hypokalemia with potassium at 3.3.  Secondary to his diuretic, potassium supplements were added to help with  hypokalemia.  Recheck lytes on December 20, 2007, shows hypokalemia to have  resolved with sodium 138, potassium 3.9, chloride 105, CO2 25, BUN 21,  creatinine 1.24, and glucose 98.  Recheck LFTs revealed SGOT 31, SGPT  46, alkaline phos 4.3, and T-bili 1.5.  Due to his elevated hemoglobin  levels, Hem/Onc was consulted for input.  Dr. Lamonte Sakai evaluated the patient  with recommendations for arterial blood gas, carboxyhemoglobin, as well  as peripheral blood for JAK-2 gene mutation and serum level for iron  study and erythropoietin in addition to MMA.  Also recommended red cell  mass scan; however, this is not currently done here and therefore this  was held.  The patient is to follow up with Dr. Lamonte Sakai regarding input on  all labs as well as the need for further workup, which can be done on  outpatient basis.  Current labs available revealed erythropoietin at  4.4, iron at 100, MMA at 414, ferritin at 337, and transferrin at 190.  Co-oximetry shows total hemoglobin at 18.3.  The patient is to set up  follow up appointment with Dr. Lamonte Sakai in the next couple of weeks.   The patient has been maintained on Coumadin throughout his stay.  PT/INR  is therapeutic at time of discharge at 30.0 and 2.6.  The patient is  discharged on 2 mg of Coumadin a day with next protime to be checked on  December 25, 2007, at Dr. Janyth Pupa office.  Dr. Verlon Setting has graciously  agreed to follow the patient's protimes and adjust Coumadin as needed.  Overall in his stay, Antonio [name] has progressed along well.  Rehab  nursing has followed the patient for bowel and bladder issues as well as  skin care for prevention of breakdown.   PT/OT has worked with the  patient regarding endurance level as well as exercise group has focused  on using right upper extremity for ADL needs.  Overall, the patient was  at min level for mobility and has progressed to supervision level for  transfers and ambulation with rolling walker.  The family has been  completed in regarding safety as well as supervision that will be needed  past discharge.  OT has been working with wife to demonstrate increase  in safety of awareness and she can provide 24-hour supervision past  discharge.  In terms of OT, the patient was limited initially with right-  sided weakness with decrease in endurance, some visual deficits, in  addition to decrease in static and dynamic standing balance.  OT has  been focusing on use of right upper extremity with self care, also in  weight shifting and weightbearing.  Overall, the patient is at  supervision level for self care.  Speech has been working with the  patient for dysphasia as well as cognition.  The patient is currently at  modified independent level for all basic cognitive problems and at  supervision level for complex level problems.  He is modified  independent level for awareness and reasoning regarding his deficits.  He requires min assist supervision to utilize strategies for speech  intelligibility.  The patient will continue to receive further followup  outpatient PT/OT, and speech therapy at [facility]  beginning on December 25, 2007.  On December 21, 2007, the patient is  discharged to home.   DISCHARGE MEDICATIONS:  1. Coumadin 2 mg one p.o. q.p.m.  2. Foltx one p.o. per day.  3. Ecotrin 81 mg a day.  4. Hydrochlorothiazide 12.5 mg a day.  5. K-Dur 10 mEq 2 pills p.o. per day.  6. Prinivil 20 mg a day.  7. Zocor 20 mg a day.  8. Tylenol as needed for pain.   Diet is low fat and balance.  Activity level is at intermittent  supervision.  No strenuous activity.  No alcohol, no  smoking, no  driving, and no tobacco use.   FOLLOW UP:  The patient is to follow up with Dr. Theda Sers on February 25, 2008, at 1:20.  Follow up with Dr. Kathryne Eriksson in 2 weeks. Follow up  with Dr. Verlon Setting on January 10, 2008, for office appointment and on December 25, 2007, for protime draws.  Outpatient PT/OT and speech therapy  beginning December 25, 2007, at 11:30 a.m. at [facility].      Thornton Dales, P.A.       ______________________________  Jarvis Morgan, M.D.    PP/MEDQ  D:  12/21/2007  T:  12/22/2007  Job:  [id number]   cc:   Jama Flavors. Redmond Pulling, M.D.  Kaylyn Lim., M.D.

## 2010-09-28 NOTE — Discharge Summary (Signed)
NAME:  Antonio [name], Antonio [name] NO.:  [id number]   MEDICAL RECORD NO.:  [id number]          PATIENT TYPE:  INP   LOCATION:  [id number]                         FACILITY:  [facility]   PHYSICIAN:  Adele Barthel, MD    DATE OF BIRTH:  09/03/1940   DATE OF ADMISSION:  12/10/2007  DATE OF DISCHARGE:  12/13/2007                               DISCHARGE SUMMARY   PRIMARY CARE PHYSICIAN:  Jama Flavors. Redmond Pulling, M.D.   CARDIOLOGIST:  Carlean Jews, M.D. at [facility].   ADMISSION HISTORY:  A 70 year old gentleman who has not seen a doctor  for the last 6-7 years, has been doing apparently okay until the morning  of the admission when he woke up and was found to have poor  coordination.  He dropped his coffee mug and was very much struggling to  do normal chores.  He was taken to the primary care office.  He was  found to have a blood pressure of 210/110.  He started to have slurred  speech; however, the weakness in the leg got better.  He was started on  lisinopril.  In the doctor's office, his blood pressure did not get  better.  He was subsequently brought to the hospital for further  evaluation.  At Dr. [name] office, the patient was also found to be in  atrial fibrillation with some elements of vertigo.  He was very  diaphoretic and very dizzy.  The patient was brought into the emergency  room for further evaluation and management.   HOSPITAL COURSE:  Following issues were addressed during the hospital  course.  1. Stroke.  On evaluation, the patient had right-sided hemiparesis      involving right-sided facial nerve and upper and lower extremity      muscle weakness, 3+ to 4/5 in strength.  He underwent an MRI of the      brain, which showed acute infarct in the posterior left corona      radiata extending into posterior limb of the internal capsule and      without any mass effect or hemorrhage and superimposed advanced      chronic small vessel ischemic disease.  MRI showed  intracranial      atherosclerosis which involving left supraglenoid ICA, left MCA,      left vertebral artery, and right posterior cerebellar artery      without any high-grade stenosis suggesting chronic hypertension.      His strength gradually improved at the time of discharge.  His      strength is almost 4+/5 in right upper and lower extremities.  His      echocardiogram was actually a suboptimal study, did not correctly      visualize whether there is intracardiac clot or not.  However,      since the patient had a very high chart score, he was started on      Coumadin for future stroke risk factor.  His carotid duplex could      not show any carotid artery stenosis, so the patient at this time  is getting transferred to [facility] for further      regaining strength.  He is going on aspirin as well as he is      getting discharged on aspirin as well as Coumadin.  2. Uncontrolled hypertension.  The patient's blood pressures stayed      high during his initial hospitalization.  Not a lot of efforts were      done to decrease the blood pressure in order to promote being      perfusion.  However, at the time of discharge, his systolic blood      pressure is 0000000 and diastolic is 90.  I am adding      hydrochlorothiazide 50 mg p.o. daily in addition to lisinopril 20      mg p.o. daily.  He needs to follow up with his primary care      physician as an outpatient for further evaluation and management.  3. Atrial fibrillation.  The patient appears to have atrial      fibrillation.  In some instances, it may look like he was in sinus      rhythm, but I felt it was more persistent atrial fibrillation.  In      any case, his chart score is 3/5 with stroke, age, and a      longstanding hypertension.  He is at very high risk for developing      subsequent stroke; therefore, the patient is getting discharged on      Coumadin.  His INR needs to be closely watched.  He will  follow up      with Dr. Verlon Setting as an outpatient for further evaluation and      management.  His rate is already controlled with his heart rate in      58 and 60, so he does not need any rate controlling agents.  Also,      he is not symptomatic at this time with his atrial fibrillation in      terms of palpitation or dizziness.  4. Cholesterol.  The patient's lipid profile was checked, which was      actually his LDL cholesterol.  He is still going him on some small      dose of simvastatin, given his recent stroke and significant      atherosclerosis throughout his intracranial arteries.   DISCHARGE DIAGNOSES:  1. Acute stroke with significant intracranial atherosclerosis.  2. Hypertension.  3. Atrial fibrillation, paroxysmal/persistent,  not sure.   IMAGING RESULTS:  MRI and MRA as mentioned under hospital course.  Two-D  echo, left ventricular systolic function normal.  Left ejection fraction  60-70%.  Could not visualize left ventricular regional wall motion  abnormalities.  Left ventricular diastolic functions were normal.  Again, LDL cholesterol 82.  Carotid duplex- vertebral artery flow  antegrade bilaterally, no significant right ICA or left ICA stenosis.   DISCHARGE MEDICATIONS:  At the time of discharge, the patient is going  home on following medications:  1. Aspirin enteric-coated 325 mg p.o. daily.  2. Coumadin 5 mg p.o. daily.  3. Simvastatin 10 mg p.o. daily.  4. Lisinopril 20 mg p.o. daily.  5. Hydrochlorothiazide 50 mg p.o. daily.   DISPOSITION:  1. The patient is getting transferred to [place] in      order to regain his strength from his acute stroke.  2. The patient will follow up with Dr. Redmond Pulling in his office as an  outpatient for further evaluation and management of his INR and      Coumadin dose adjustment with Dr. Jama Flavors. Wilson.  3. The patient is to follow up with Dr. Verlon Setting in [facility] as an      outpatient.  Once he is  discharged home, Short-Term Inpatient Rehab      for evaluation and management of atrial fibrillation.  4. The patient is to follow up with Dr. Jama Flavors. Wilson for further      management of his INR, for atrial fibrillation, as well as      hypertension management.   TOTAL TIME SPENT IN DISCHARGE OF THIS PATIENT:  Around NR.      Adele Barthel, MD  Electronically Signed     NP/MEDQ  D:  12/13/2007  T:  12/14/2007  Job:  [id number]   cc:   Kaylyn Lim., M.D.  Jama Flavors. Redmond Pulling, M.D.

## 2011-02-11 LAB — BASIC METABOLIC PANEL
BUN: 10
BUN: 12
BUN: 19
CO2: 26
CO2: 28
CO2: 26
Calcium: 8.8
Calcium: 8.9
Chloride: 109
Chloride: 110
Chloride: 110
Creatinine, Ser: 1.18
Creatinine, Ser: 1.37
GFR calc Af Amer: >60
GFR calc Af Amer: >60
GFR calc Af Amer: >60
GFR calc non Af Amer: 51 — ABNORMAL LOW
Glucose, Bld: 80
Potassium: 3.4 — ABNORMAL LOW
Potassium: 3.5
Sodium: 142
Sodium: 140

## 2011-02-11 LAB — CBC
HCT: 49.6
Hemoglobin: 17.8 — ABNORMAL HIGH
Hemoglobin: 19 — ABNORMAL HIGH
Hemoglobin: 18.1 — ABNORMAL HIGH
MCHC: 33.3
MCHC: 34
MCV: 92.6
MCV: 93.4
Platelets: 200
RBC: 5.39
RBC: 5.73
RBC: 6.01 — ABNORMAL HIGH
RBC: 5.76
WBC: 10
WBC: 11 — ABNORMAL HIGH
WBC: 11.8 — ABNORMAL HIGH
WBC: 8.2

## 2011-02-11 LAB — HEPATIC FUNCTION PANEL
Alkaline Phosphatase: 52
Bilirubin, Direct: 0.3
Indirect Bilirubin: 1.3 — ABNORMAL HIGH
Total Bilirubin: 1.6 — ABNORMAL HIGH

## 2011-02-11 LAB — COMPREHENSIVE METABOLIC PANEL
ALT: 19
AST: 23
AST: 31
Albumin: 3.9
Alkaline Phosphatase: 43
BUN: 21
CO2: 25
Calcium: 8.9
Chloride: 105
Creatinine, Ser: 1.06
GFR calc Af Amer: >60
GFR calc non Af Amer: >60
GFR calc non Af Amer: 58 — ABNORMAL LOW
Potassium: 3.9
Sodium: 135
Total Bilirubin: 1.5 — ABNORMAL HIGH
Total Protein: 6

## 2011-02-11 LAB — PROTIME-INR
INR: 1
INR: 1.9 — ABNORMAL HIGH
INR: 2.2 — ABNORMAL HIGH
INR: 2.4 — ABNORMAL HIGH
INR: 2.6 — ABNORMAL HIGH
INR: 2.8 — ABNORMAL HIGH
Prothrombin Time: 13.3
Prothrombin Time: 22.8 — ABNORMAL HIGH
Prothrombin Time: 31 — ABNORMAL HIGH

## 2011-02-11 LAB — LIPID PANEL
LDL Cholesterol: 86
Total CHOL/HDL Ratio: 4.9
VLDL: 12

## 2011-02-11 LAB — URINE CULTURE
Colony Count: NO GROWTH
Culture: NO GROWTH
Special Requests: NEGATIVE

## 2011-02-11 LAB — DIFFERENTIAL
Basophils Absolute: 0
Basophils Relative: 0
Basophils Relative: 0
Lymphocytes Relative: 15
Lymphocytes Relative: 26
Lymphs Abs: 2.1
Monocytes Absolute: 0.8
Monocytes Absolute: 0.6
Monocytes Relative: 7
Monocytes Relative: 8
Neutro Abs: 9.2 — ABNORMAL HIGH
Neutro Abs: 5.2
Neutrophils Relative %: 78 — ABNORMAL HIGH
Neutrophils Relative %: 63

## 2011-02-11 LAB — CARBOXYHEMOGLOBIN
Carboxyhemoglobin: 0.9
Methemoglobin: 0.5
O2 Saturation: 94.1
Total hemoglobin: 18.3 — ABNORMAL HIGH

## 2011-02-11 LAB — METHYLMALONIC ACID, SERUM: Methylmalonic Acid, Quantitative: 414 nmol/L — ABNORMAL HIGH (ref 87–318)

## 2011-02-11 LAB — IRON AND TIBC
Iron: 96
UIBC: 199

## 2011-02-11 LAB — URINALYSIS, ROUTINE W REFLEX MICROSCOPIC
Bilirubin Urine: NEGATIVE
Glucose, UA: NEGATIVE
Glucose, UA: NEGATIVE
Hgb urine dipstick: NEGATIVE
Ketones, ur: NEGATIVE
Leukocytes, UA: NEGATIVE
Nitrite: NEGATIVE
Specific Gravity, Urine: 1.018
Specific Gravity, Urine: 1.022
pH: 6
pH: 6

## 2011-02-11 LAB — HEMOGLOBIN A1C
Hgb A1c MFr Bld: 5.2
Mean Plasma Glucose: 108

## 2011-02-11 LAB — URINE MICROSCOPIC-ADD ON

## 2011-02-11 LAB — CK TOTAL AND CKMB (NOT AT ARMC)
Relative Index: INVALID
Total CK: 76

## 2011-02-11 LAB — TROPONIN I: Troponin I: 0.01

## 2011-02-11 LAB — TRANSFERRIN: Transferrin: 190 — ABNORMAL LOW

## 2011-02-11 LAB — FERRITIN: Ferritin: 337 — ABNORMAL HIGH (ref 22–322)

## 2011-02-11 LAB — APTT: aPTT: 39 — ABNORMAL HIGH

## 2011-02-13 ENCOUNTER — Other Ambulatory Visit: Payer: Self-pay | Admitting: Cardiology

## 2011-02-21 ENCOUNTER — Other Ambulatory Visit: Payer: Self-pay | Admitting: Cardiology

## 2011-02-25 ENCOUNTER — Telehealth: Payer: Self-pay | Admitting: Cardiology

## 2011-02-25 NOTE — Telephone Encounter (Signed)
Pt [facility] [place]. Pt has been out of RX for week. Please call RX in ASAP.

## 2011-03-02 ENCOUNTER — Other Ambulatory Visit: Payer: Self-pay | Admitting: Cardiology

## 2011-03-21 ENCOUNTER — Other Ambulatory Visit: Payer: Self-pay | Admitting: Cardiology

## 2011-03-21 DIAGNOSIS — E7800 Pure hypercholesterolemia, unspecified: Secondary | ICD-10-CM

## 2011-03-21 MED ORDER — SIMVASTATIN 20 MG PO TABS: 20.0000 mg | ORAL_TABLET | Freq: Every day | ORAL | Status: DC

## 2011-03-21 NOTE — Telephone Encounter (Signed)
New message Pt wants refill of simvastatin [facility] She said that dr Verlon Setting had originally written this rx  And she would like to talk to you Please call

## 2011-03-22 ENCOUNTER — Ambulatory Visit: Payer: Medicare Other | Admitting: Cardiology

## 2011-04-22 ENCOUNTER — Encounter: Payer: Self-pay | Admitting: Cardiology

## 2011-04-22 ENCOUNTER — Ambulatory Visit (INDEPENDENT_AMBULATORY_CARE_PROVIDER_SITE_OTHER): Payer: Medicare Other | Admitting: Cardiology

## 2011-04-22 VITALS — BP 152/80 | HR 80 | Ht 69.0 in | Wt 205.0 lb

## 2011-04-22 DIAGNOSIS — I10 Essential (primary) hypertension: Secondary | ICD-10-CM

## 2011-04-22 DIAGNOSIS — I480 Paroxysmal atrial fibrillation: Secondary | ICD-10-CM

## 2011-04-22 DIAGNOSIS — I4891 Unspecified atrial fibrillation: Secondary | ICD-10-CM

## 2011-04-22 DIAGNOSIS — E785 Hyperlipidemia, unspecified: Secondary | ICD-10-CM

## 2011-04-22 NOTE — Progress Notes (Signed)
Antonio [name] Date of Birth: [birth year]   History of Present Illness: Antonio [name] is seen for followup today. He states he is feeling very well. His blood pressure readings at home have been anywhere from [id number] systolic. He has lost 5 pounds. He denies any dizziness, chest pain, shortness of breath, or increased edema.  Current Outpatient Prescriptions on File Prior to Visit  Medication Sig Dispense Refill  . amLODipine (NORVASC) 10 MG tablet Take 1 tablet (10 mg total) by mouth daily.  30 tablet  11  . Ascorbic Acid (VITAMIN C) 500 MG tablet Take 1 tablet (500 mg total) by mouth daily.  30 tablet    . Coenzyme Q10 (COQ10) 200 MG CAPS Take by mouth daily.      . dabigatran (PRADAXA) 150 MG CAPS Take 1 capsule (150 mg total) by mouth every 12 (twelve) hours.  60 capsule  6  . FOLBIC 2.5-25-2 MG TABS TAKE 1 TABLET BY MOUTH EVERY DAY  30 tablet  5  . hydrochlorothiazide (MICROZIDE) 12.5 MG capsule TAKE ONE CAPSULE BY MOUTH EVERY DAY  30 capsule  5  . lisinopril (PRINIVIL,ZESTRIL) 20 MG tablet TAKE 1 TABLET BY MOUTH EVERY DAY  30 tablet  5  . simvastatin (ZOCOR) 20 MG tablet Take 1 tablet (20 mg total) by mouth at bedtime.  30 tablet  5  . aspirin 81 MG tablet Take 81 mg by mouth daily.          No Known Allergies  Past Medical History  Diagnosis Date  . Hypertension   . Stroke   . Anticoagulated on warfarin     Replaced with Pradaxa  . Hyperlipidemia   . Chronic anticoagulation   . CRI (chronic renal insufficiency)   . PAF (paroxysmal atrial fibrillation)   . Spinal stenosis      L4-5  . Coronary artery disease   . Hypercholesterolemia   . Elevated serum creatinine     improved with gentle hydration  . Hypotension     Postoperative hypotension improved with holding medication  . Stroke      Status post left hemisphere stroke with right-sided weakness  . Dyslipidemia     Past Surgical History  Procedure Date  . Cholecystectomy   . Cataract extraction   . Back surgery      History  Smoking status  . Former Smoker  . Quit date: 05/16/1968  Smokeless tobacco  . Not on file    History  Alcohol Use No    Family History  Problem Relation Age of Onset  . Hypertension Mother   . Stroke Mother   . Heart disease Father   . Diabetes Father   . Diabetes Brother   . Diabetes Brother   . Cancer Father      history of prostate, bladder and colon cancer    Review of System: As noted in history of present illness.  All other systems were reviewed and are negative.  Physical Exam: BP 152/80  Pulse 80  Ht 5\' 9"  (1.753 m)  Wt 205 lb (92.987 kg)  BMI 30.27 kg/m2 Patient is very pleasant and in no acute distress. Skin is warm and dry. Color is normal.  HEENT is unremarkable. Normocephalic/atraumatic. PERRL. Sclera are nonicteric. Neck is supple. No masses. No JVD. Lungs are clear. Cardiac exam shows an irregular rhythm today. Abdomen is soft. Extremities are with just trace edema. He does have a resting tremor in his left hand. Gait and ROM are intact.  No gross neurologic deficits noted.  LABORATORY DATA:   Assessment / Plan:

## 2011-04-22 NOTE — Assessment & Plan Note (Signed)
Blood work was reviewed from March of 2012. Total cholesterol was 95. Triglycerides 57, HDL 34, LDL 50. We'll continue with his current therapy.

## 2011-04-22 NOTE — Assessment & Plan Note (Signed)
His blood pressure is acceptable today. He is still taking 10 mg of Norvasc and have recommended that he continue on his current therapy and watch his salt intake.

## 2011-04-22 NOTE — Assessment & Plan Note (Signed)
His rate is well controlled and he is on chronic anticoagulation with Pradaxa. We will continue with his current therapy.

## 2011-04-22 NOTE — Patient Instructions (Signed)
Continue your current therapy  I will see you again in 6 months.   

## 2011-05-08 ENCOUNTER — Other Ambulatory Visit: Payer: Self-pay | Admitting: Nurse Practitioner

## 2011-08-10 ENCOUNTER — Other Ambulatory Visit: Payer: Self-pay | Admitting: *Deleted

## 2011-08-10 MED ORDER — LISINOPRIL 20 MG PO TABS: 20.0000 mg | ORAL_TABLET | Freq: Every day | ORAL | Status: DC

## 2011-08-16 ENCOUNTER — Other Ambulatory Visit: Payer: Self-pay | Admitting: *Deleted

## 2011-08-16 MED ORDER — HYDROCHLOROTHIAZIDE 12.5 MG PO CAPS: 12.5000 mg | ORAL_CAPSULE | Freq: Every day | ORAL | Status: DC

## 2011-08-31 ENCOUNTER — Other Ambulatory Visit: Payer: Self-pay | Admitting: Cardiology

## 2011-08-31 MED ORDER — FA-PYRIDOXINE-CYANOCOBALAMIN 2.5-25-2 MG PO TABS: 1.0000 | ORAL_TABLET | Freq: Every day | ORAL | Status: DC

## 2011-09-05 ENCOUNTER — Other Ambulatory Visit: Payer: Self-pay | Admitting: Cardiology

## 2011-09-05 MED ORDER — AMLODIPINE BESYLATE 10 MG PO TABS: 10.0000 mg | ORAL_TABLET | Freq: Every day | ORAL | Status: DC

## 2011-10-20 ENCOUNTER — Ambulatory Visit (INDEPENDENT_AMBULATORY_CARE_PROVIDER_SITE_OTHER): Payer: Medicare Other | Admitting: Cardiology

## 2011-10-20 ENCOUNTER — Encounter: Payer: Self-pay | Admitting: Cardiology

## 2011-10-20 VITALS — BP 130/72 | HR 60 | Ht 69.0 in | Wt 206.8 lb

## 2011-10-20 DIAGNOSIS — I480 Paroxysmal atrial fibrillation: Secondary | ICD-10-CM

## 2011-10-20 DIAGNOSIS — I10 Essential (primary) hypertension: Secondary | ICD-10-CM

## 2011-10-20 DIAGNOSIS — IMO0001 Reserved for inherently not codable concepts without codable children: Secondary | ICD-10-CM

## 2011-10-20 DIAGNOSIS — Z7901 Long term (current) use of anticoagulants: Secondary | ICD-10-CM

## 2011-10-20 DIAGNOSIS — I4891 Unspecified atrial fibrillation: Secondary | ICD-10-CM

## 2011-10-20 DIAGNOSIS — E785 Hyperlipidemia, unspecified: Secondary | ICD-10-CM

## 2011-10-20 DIAGNOSIS — M791 Myalgia, unspecified site: Secondary | ICD-10-CM

## 2011-10-20 LAB — BASIC METABOLIC PANEL
BUN: 30 mg/dL — ABNORMAL HIGH (ref 6–23)
CO2: 23 mEq/L (ref 19–32)
GFR: 35.1 mL/min — ABNORMAL LOW (ref >60.00)
Glucose, Bld: 80 mg/dL (ref 70–99)
Potassium: 4.4 mEq/L (ref 3.5–5.1)
Sodium: 140 mEq/L (ref 135–145)

## 2011-10-20 LAB — CBC WITH DIFFERENTIAL/PLATELET
Basophils Absolute: 0 10*3/uL (ref 0.0–0.1)
HCT: 44.2 % (ref 39.0–52.0)
Hemoglobin: 14.7 g/dL (ref 13.0–17.0)
Lymphs Abs: 2.8 10*3/uL (ref 0.7–4.0)
MCV: 92.7 fl (ref 78.0–100.0)
Monocytes Absolute: 0.7 10*3/uL (ref 0.1–1.0)
Neutro Abs: 5.4 10*3/uL (ref 1.4–7.7)
Platelets: 202 10*3/uL (ref 150.0–400.0)
RDW: 14.2 % (ref 11.5–14.6)

## 2011-10-20 LAB — HEPATIC FUNCTION PANEL
AST: 18 U/L (ref 0–37)
Albumin: 4 g/dL (ref 3.5–5.2)
Alkaline Phosphatase: 48 U/L (ref 39–117)
Total Protein: 6.9 g/dL (ref 6.0–8.3)

## 2011-10-20 LAB — LIPID PANEL
Cholesterol: 94 mg/dL (ref 0–200)
HDL: 39.8 mg/dL (ref >39.00)
VLDL: 11.4 mg/dL (ref 0.0–40.0)

## 2011-10-20 LAB — CK: Total CK: 66 U/L (ref 7–232)

## 2011-10-20 NOTE — Assessment & Plan Note (Signed)
He appears to be maintaining sinus rhythm well. He is on chronic anticoagulation with Pradaxa given his prior history of stroke.

## 2011-10-20 NOTE — Assessment & Plan Note (Signed)
Since he is experiencing myalgias we will repeat fasting lab work today including chemistries, CPK, and lipids. I recommended that he stop taking Zocor for 3-4 weeks. If his symptoms of myalgias resolve then we may need to consider an alternative statin and low-dose. If his symptoms do not improve then he should resume his medication and evaluate for other causes.

## 2011-10-20 NOTE — Patient Instructions (Signed)
We will check blood work on you today.  Stop taking Zocor- if your pain doesn't improve in 3-4 weeks you should resume it. If your pain gets a lot better we should consider alternative cholesterol medication.  I will see you again in 6 months.

## 2011-10-20 NOTE — Progress Notes (Signed)
   Antonio [name] Date of Birth: [birth year]   History of Present Illness: Antonio [name] is seen for followup today. He states he is feeling well. He does complain of his legs are stiff and sore all the time. He has reduced his Zocor by half and this has helped some. He notes that he bruises easily but has had no significant bleeding. He denies any chest pain, palpitations, or shortness of breath.  Current Outpatient Prescriptions on File Prior to Visit  Medication Sig Dispense Refill  . Ascorbic Acid (VITAMIN C) 500 MG tablet Take 1 tablet (500 mg total) by mouth daily.  30 tablet    . Coenzyme Q10 (COQ10) 200 MG CAPS Take by mouth daily.      . folic acid-pyridoxine-cyancobalamin (FOLBIC) 2.5-25-2 MG TABS Take 1 tablet by mouth daily.  30 tablet  5  . hydrochlorothiazide (MICROZIDE) 12.5 MG capsule Take 1 capsule (12.5 mg total) by mouth daily.  30 capsule  5  . lisinopril (PRINIVIL,ZESTRIL) 20 MG tablet Take 1 tablet (20 mg total) by mouth daily.  30 tablet  5  . PRADAXA 150 MG CAPS TAKE ONE CAPSULE BY MOUTH EVERY 12 HOURS  60 capsule  6    No Known Allergies  Past Medical History  Diagnosis Date  . Hypertension   . Chronic anticoagulation     Replaced with Pradaxa  . Hyperlipidemia   . CRI (chronic renal insufficiency)   . PAF (paroxysmal atrial fibrillation)   . Spinal stenosis      L4-5  . Coronary artery disease   . Hypercholesterolemia   . Elevated serum creatinine     improved with gentle hydration  . Hypotension     Postoperative hypotension improved with holding medication  . Stroke      Status post left hemisphere stroke with right-sided weakness  . Dyslipidemia     Past Surgical History  Procedure Date  . Cholecystectomy   . Cataract extraction   . Back surgery     History  Smoking status  . Former Smoker  . Quit date: 05/16/1968  Smokeless tobacco  . Not on file    History  Alcohol Use No    Family History  Problem Relation Age of Onset  .  Hypertension Mother   . Stroke Mother   . Heart disease Father   . Diabetes Father   . Diabetes Brother   . Diabetes Brother   . Cancer Father      history of prostate, bladder and colon cancer    Review of System: As noted in history of present illness.  All other systems were reviewed and are negative.  Physical Exam: BP 130/72  Pulse 60  Ht 5\' 9"  (1.753 m)  Wt 206 lb 12.8 oz (93.804 kg)  BMI 30.54 kg/m2 Patient is very pleasant and in no acute distress. Skin is warm and dry. Color is normal.  HEENT is unremarkable. Normocephalic/atraumatic. PERRL. Sclera are nonicteric. Neck is supple. No masses. No JVD. Lungs are clear. Cardiac exam shows an irregular rhythm today. Abdomen is soft. Extremities are with just trace edema. He does have a resting tremor in his left hand. Gait and ROM are intact. No gross neurologic deficits noted.  LABORATORY DATA: ECG today demonstrates normal sinus rhythm with first degree AV block. Cannot rule out septal infarct old.  Assessment / Plan:

## 2011-10-20 NOTE — Assessment & Plan Note (Signed)
Blood pressure is well controlled today. Continue his current therapy.

## 2011-10-21 ENCOUNTER — Telehealth: Payer: Self-pay | Admitting: Cardiology

## 2011-10-21 NOTE — Telephone Encounter (Signed)
Close  

## 2011-11-29 ENCOUNTER — Other Ambulatory Visit: Payer: Self-pay | Admitting: *Deleted

## 2011-11-29 MED ORDER — DABIGATRAN ETEXILATE MESYLATE 150 MG PO CAPS: 150.0000 mg | ORAL_CAPSULE | Freq: Two times a day (BID) | ORAL | Status: DC

## 2012-02-16 ENCOUNTER — Other Ambulatory Visit: Payer: Self-pay | Admitting: *Deleted

## 2012-02-16 MED ORDER — LISINOPRIL 20 MG PO TABS: 20.0000 mg | ORAL_TABLET | Freq: Every day | ORAL | Status: DC

## 2012-03-07 ENCOUNTER — Other Ambulatory Visit: Payer: Self-pay

## 2012-03-07 ENCOUNTER — Telehealth: Payer: Self-pay | Admitting: Cardiology

## 2012-03-07 MED ORDER — FA-PYRIDOXINE-CYANOCOBALAMIN 2.5-25-2 MG PO TABS: 1.0000 | ORAL_TABLET | Freq: Every day | ORAL | Status: DC

## 2012-03-07 MED ORDER — HYDROCHLOROTHIAZIDE 12.5 MG PO CAPS: 12.5000 mg | ORAL_CAPSULE | Freq: Every day | ORAL | Status: DC

## 2012-03-07 NOTE — Telephone Encounter (Signed)
Pt's wife calling re refill of hztz and folbic , [facility], said they requested 3 times and pt is now out

## 2012-03-21 ENCOUNTER — Telehealth: Payer: Self-pay

## 2012-03-21 MED ORDER — AMLODIPINE BESYLATE 10 MG PO TABS: 10.0000 mg | ORAL_TABLET | Freq: Every day | ORAL | Status: DC

## 2012-03-21 NOTE — Telephone Encounter (Signed)
Spoke to patient's wife was told received refill request from [facility] for amlodipine 10 mg.Wife stated patient takes amlodipine 10 mg daily.Prescription renewed.

## 2012-05-30 ENCOUNTER — Ambulatory Visit (INDEPENDENT_AMBULATORY_CARE_PROVIDER_SITE_OTHER): Payer: Medicare Other | Admitting: Cardiology

## 2012-05-30 ENCOUNTER — Encounter: Payer: Self-pay | Admitting: Cardiology

## 2012-05-30 VITALS — BP 134/68 | HR 69 | Ht 69.0 in | Wt 208.4 lb

## 2012-05-30 DIAGNOSIS — I480 Paroxysmal atrial fibrillation: Secondary | ICD-10-CM

## 2012-05-30 DIAGNOSIS — Z7901 Long term (current) use of anticoagulants: Secondary | ICD-10-CM

## 2012-05-30 DIAGNOSIS — I4891 Unspecified atrial fibrillation: Secondary | ICD-10-CM

## 2012-05-30 DIAGNOSIS — E785 Hyperlipidemia, unspecified: Secondary | ICD-10-CM

## 2012-05-30 DIAGNOSIS — I10 Essential (primary) hypertension: Secondary | ICD-10-CM

## 2012-05-30 NOTE — Patient Instructions (Signed)
Continue your current therapy  I will see you again in 6 months.   

## 2012-06-01 NOTE — Progress Notes (Signed)
Antonio [name] Date of Birth: [birth year]   History of Present Illness: Antonio [name] is seen for followup today. He states he is feeling well. He tried stopping Zocor but noticed no improvement in his leg pain so he went back on it. He notes that he bruises easily but has had no significant bleeding. He denies any chest pain, palpitations, or shortness of breath.  Current Outpatient Prescriptions on File Prior to Visit  Medication Sig Dispense Refill  . acetaminophen (TYLENOL) 325 MG tablet Take 325 mg by mouth as needed.      Marland Kitchen amLODipine (NORVASC) 10 MG tablet Take 1 tablet (10 mg total) by mouth daily.  30 tablet  6  . Ascorbic Acid (VITAMIN C) 500 MG tablet Take 1 tablet (500 mg total) by mouth daily.  30 tablet    . Coenzyme Q10 (COQ10) 200 MG CAPS Take by mouth daily.      . dabigatran (PRADAXA) 150 MG CAPS Take 1 capsule (150 mg total) by mouth every 12 (twelve) hours.  60 capsule  9  . folic acid-pyridoxine-cyancobalamin (FOLBIC) 2.5-25-2 MG TABS Take 1 tablet by mouth daily.  30 tablet  5  . hydrochlorothiazide (MICROZIDE) 12.5 MG capsule Take 1 capsule (12.5 mg total) by mouth daily.  30 capsule  5  . HYDROcodone-acetaminophen (NORCO) 5-325 MG per tablet Take 1 tablet by mouth as needed.      Marland Kitchen lisinopril (PRINIVIL,ZESTRIL) 20 MG tablet Take 1 tablet (20 mg total) by mouth daily.  30 tablet  5  . simvastatin (ZOCOR) 10 MG tablet Take 10 mg by mouth at bedtime.        No Known Allergies  Past Medical History  Diagnosis Date  . Hypertension   . Chronic anticoagulation     Replaced with Pradaxa  . Hyperlipidemia   . CRI (chronic renal insufficiency)   . PAF (paroxysmal atrial fibrillation)   . Spinal stenosis      L4-5  . Coronary artery disease   . Hypercholesterolemia   . Elevated serum creatinine     improved with gentle hydration  . Hypotension     Postoperative hypotension improved with holding medication  . Stroke      Status post left hemisphere stroke with  right-sided weakness  . Dyslipidemia     Past Surgical History  Procedure Date  . Cholecystectomy   . Cataract extraction   . Back surgery     History  Smoking status  . Former Smoker  . Quit date: 05/16/1968  Smokeless tobacco  . Not on file    History  Alcohol Use No    Family History  Problem Relation Age of Onset  . Hypertension Mother   . Stroke Mother   . Heart disease Father   . Diabetes Father   . Diabetes Brother   . Diabetes Brother   . Cancer Father      history of prostate, bladder and colon cancer    Review of System: As noted in history of present illness.  All other systems were reviewed and are negative.  Physical Exam: BP 134/68  Pulse 69  Ht 5\' 9"  (1.753 m)  Wt 208 lb 6.4 oz (94.53 kg)  BMI 30.78 kg/m2  SpO2 98% Patient is very pleasant and in no acute distress. Skin is warm and dry. Color is normal.  HEENT is unremarkable. Normocephalic/atraumatic. PERRL. Sclera are nonicteric. Neck is supple. No masses. No JVD. Lungs are clear. Cardiac exam shows an irregular rhythm  today. Abdomen is soft. Extremities are with just trace edema. He does have a resting tremor in his left hand. Gait and ROM are intact. No gross neurologic deficits noted.  LABORATORY DATA:   Assessment / Plan: 1.Paroxysmal Atrial fibrillation. No significant symptoms. Continue Pradaxa.  2. HTN controlled.  3. Hyperlipidemia. Will request a copy of most recent lab with primary MD.

## 2012-08-17 ENCOUNTER — Other Ambulatory Visit: Payer: Self-pay

## 2012-08-17 MED ORDER — LISINOPRIL 20 MG PO TABS: 20.0000 mg | ORAL_TABLET | Freq: Every day | ORAL | Status: DC

## 2012-08-28 ENCOUNTER — Other Ambulatory Visit: Payer: Self-pay | Admitting: Cardiology

## 2012-10-12 ENCOUNTER — Other Ambulatory Visit: Payer: Self-pay | Admitting: Cardiology

## 2012-10-12 NOTE — Telephone Encounter (Signed)
amLODipine (NORVASC) 10 MG tablet  Take 1 tablet (10 mg total) by mouth daily.   30 tablet   6  Patient Instructions  Continue your current therapy I will see you again in 6 months.   Chart Reviewed By    Luanna Salk, LPN  on [id number]  [id number] AM

## 2012-10-17 ENCOUNTER — Other Ambulatory Visit: Payer: Self-pay | Admitting: Cardiology

## 2012-10-17 ENCOUNTER — Other Ambulatory Visit: Payer: Self-pay

## 2012-10-17 MED ORDER — DABIGATRAN ETEXILATE MESYLATE 150 MG PO CAPS: 150.0000 mg | ORAL_CAPSULE | Freq: Two times a day (BID) | ORAL | Status: DC

## 2012-10-17 NOTE — Telephone Encounter (Signed)
dabigatran (PRADAXA) 150 MG CAPS  Take 1 capsule (150 mg total) by mouth every 12 (twelve) hours.   60 capsule   9  Assessment / Plan:  1.Paroxysmal Atrial fibrillation. No significant symptoms. Continue Pradaxa. Electronic signature on 05/30/2012 10:41 AM

## 2012-10-18 NOTE — Telephone Encounter (Signed)
dabigatran (PRADAXA) 150 MG CAPS 60 capsule 6 10/17/2012      Sig - Route:  Take 1 capsule (150 mg total) by mouth every 12 (twelve) hours. - Oral    Class:  Normal    Authorizing Provider:  Peter M Martinique, MD    Ordering User:  Garret Reddish, CMA

## 2012-10-19 ENCOUNTER — Other Ambulatory Visit: Payer: Self-pay | Admitting: Cardiology

## 2012-10-22 ENCOUNTER — Telehealth (HOSPITAL_COMMUNITY): Payer: Self-pay

## 2012-10-22 NOTE — Telephone Encounter (Signed)
[facility]a was refilled and ready for pt and it was duplicate requests that kept coming through to be refilled.Marland Kitchen

## 2012-11-15 ENCOUNTER — Other Ambulatory Visit: Payer: Self-pay | Admitting: Family Medicine

## 2012-11-15 DIAGNOSIS — R7989 Other specified abnormal findings of blood chemistry: Secondary | ICD-10-CM

## 2012-11-15 DIAGNOSIS — R799 Abnormal finding of blood chemistry, unspecified: Secondary | ICD-10-CM

## 2012-11-15 DIAGNOSIS — R34 Anuria and oliguria: Secondary | ICD-10-CM

## 2012-11-21 ENCOUNTER — Ambulatory Visit
Admission: RE | Admit: 2012-11-21 | Discharge: 2012-11-21 | Disposition: A | Discharge status: Home / Self Care | Payer: Medicare Other | Source: Ambulatory Visit | Attending: Family Medicine | Admitting: Family Medicine

## 2012-11-21 DIAGNOSIS — R7989 Other specified abnormal findings of blood chemistry: Secondary | ICD-10-CM

## 2012-11-21 DIAGNOSIS — R34 Anuria and oliguria: Secondary | ICD-10-CM

## 2012-12-05 ENCOUNTER — Encounter: Payer: Self-pay | Admitting: Cardiology

## 2012-12-05 ENCOUNTER — Ambulatory Visit (INDEPENDENT_AMBULATORY_CARE_PROVIDER_SITE_OTHER): Payer: Medicare Other | Admitting: Cardiology

## 2012-12-05 VITALS — BP 132/62 | HR 72 | Ht 69.0 in | Wt 211.8 lb

## 2012-12-05 DIAGNOSIS — I4891 Unspecified atrial fibrillation: Secondary | ICD-10-CM

## 2012-12-05 DIAGNOSIS — I480 Paroxysmal atrial fibrillation: Secondary | ICD-10-CM

## 2012-12-05 DIAGNOSIS — I10 Essential (primary) hypertension: Secondary | ICD-10-CM

## 2012-12-05 DIAGNOSIS — E785 Hyperlipidemia, unspecified: Secondary | ICD-10-CM

## 2012-12-05 DIAGNOSIS — Z7901 Long term (current) use of anticoagulants: Secondary | ICD-10-CM

## 2012-12-05 NOTE — Progress Notes (Signed)
Antonio [name] Date of Birth: 1941/03/29   History of Present Illness: [name] is seen for followup today. He has a history of paroxysmal atrial fibrillation managed with rate control and anticoagulation. He states he is feeling well. He has recently started physical therapy for his chronic leg weakness. He notes that he bruises easily but has had no significant bleeding. He denies any chest pain, palpitations, or shortness of breath.  Current Outpatient Prescriptions on File Prior to Visit  Medication Sig Dispense Refill  . acetaminophen (TYLENOL) 325 MG tablet Take 325 mg by mouth as needed.      Marland Kitchen amLODipine (NORVASC) 10 MG tablet TAKE 1 TABLET EVERY DAY  30 tablet  6  . Ascorbic Acid (VITAMIN C) 500 MG tablet Take 1 tablet (500 mg total) by mouth daily.  30 tablet    . Coenzyme Q10 (COQ10) 200 MG CAPS Take by mouth daily.      . dabigatran (PRADAXA) 150 MG CAPS Take 1 capsule (150 mg total) by mouth every 12 (twelve) hours.  60 capsule  6  . FOLBIC 2.5-25-2 MG TABS TAKE 1 TABLET EVERY DAY  30 tablet  5  . hydrochlorothiazide (MICROZIDE) 12.5 MG capsule TAKE 1 CAPSULE BY MOUTH DAILY  30 capsule  5  . HYDROcodone-acetaminophen (NORCO) 5-325 MG per tablet Take 1 tablet by mouth as needed.      Marland Kitchen lisinopril (PRINIVIL,ZESTRIL) 20 MG tablet Take 1 tablet (20 mg total) by mouth daily.  30 tablet  3  . simvastatin (ZOCOR) 10 MG tablet Take 10 mg by mouth at bedtime.       No current facility-administered medications on file prior to visit.    No Known Allergies  Past Medical History  Diagnosis Date  . Hypertension   . Chronic anticoagulation     Replaced with Pradaxa  . Hyperlipidemia   . CRI (chronic renal insufficiency)   . PAF (paroxysmal atrial fibrillation)   . Spinal stenosis      L4-5  . Coronary artery disease   . Hypercholesterolemia   . Elevated serum creatinine     improved with gentle hydration  . Hypotension     Postoperative hypotension improved with holding  medication  . Stroke      Status post left hemisphere stroke with right-sided weakness  . Dyslipidemia     Past Surgical History  Procedure Laterality Date  . Cholecystectomy    . Cataract extraction    . Back surgery      History  Smoking status  . Former Smoker  . Quit date: 05/16/1968  Smokeless tobacco  . Not on file    History  Alcohol Use No    Family History  Problem Relation Age of Onset  . Hypertension Mother   . Stroke Mother   . Heart disease Father   . Diabetes Father   . Diabetes Brother   . Diabetes Brother   . Cancer Father      history of prostate, bladder and colon cancer    Review of System: As noted in history of present illness.  All other systems were reviewed and are negative.  Physical Exam: BP 132/62  Pulse 72  Ht 5\' 9"  (1.753 m)  Wt 211 lb 12.8 oz (96.072 kg)  BMI 31.26 kg/m2  SpO2 97% Patient is very pleasant and in no acute distress. Skin is warm and dry. Color is normal.  HEENT is unremarkable. Normocephalic/atraumatic. PERRL. Sclera are nonicteric. Neck is supple. No  masses. No JVD. Lungs are clear. Cardiac exam shows an irregular rhythm today. Abdomen is soft. Extremities are with just trace edema. He does have a resting tremor in his left hand. Gait and ROM are intact. No gross neurologic deficits noted.  LABORATORY DATA: ECG today demonstrates normal sinus rhythm with first degree AV block. It is otherwise normal.  Assessment / Plan: 1.Paroxysmal Atrial fibrillation. No significant symptoms. Continue Pradaxa.  2. HTN controlled.  3. Hyperlipidemia. Will request a copy of most recent lab with primary MD which he had 2 weeks ago.

## 2012-12-05 NOTE — Patient Instructions (Signed)
Restrict your salt intake.  Continue your current medication  We will get a copy of your lab work from Dr. Redmond Pulling  I will see you in 6 months.

## 2012-12-11 ENCOUNTER — Other Ambulatory Visit: Payer: Self-pay | Admitting: Cardiology

## 2013-02-11 ENCOUNTER — Other Ambulatory Visit: Payer: Self-pay | Admitting: Cardiology

## 2013-04-01 ENCOUNTER — Other Ambulatory Visit: Payer: Self-pay | Admitting: Cardiology

## 2013-05-14 ENCOUNTER — Other Ambulatory Visit: Payer: Self-pay

## 2013-05-14 MED ORDER — DABIGATRAN ETEXILATE MESYLATE 150 MG PO CAPS: 150.0000 mg | ORAL_CAPSULE | Freq: Two times a day (BID) | ORAL | Status: DC

## 2013-05-28 ENCOUNTER — Other Ambulatory Visit: Payer: Self-pay | Admitting: Cardiology

## 2013-06-12 ENCOUNTER — Telehealth: Payer: Self-pay

## 2013-06-12 NOTE — Telephone Encounter (Signed)
Received fax from [place] patient scheduled for colonoscopy and endoscopy 07/03/13.Dr.Jordan okayed to hold Pradaxa 48 hours prior to procedures.Form faxed back at fax # [phone].

## 2013-06-13 ENCOUNTER — Telehealth: Payer: Self-pay | Admitting: Cardiology

## 2013-06-13 NOTE — Telephone Encounter (Signed)
Received request from Nurse fax box, documents faxed for surgical clearance. To: [facility] Fax number: [phone] Attention: [id number]/kdm

## 2013-06-18 ENCOUNTER — Ambulatory Visit: Payer: Medicare Other | Admitting: Cardiology

## 2013-07-04 ENCOUNTER — Telehealth (INDEPENDENT_AMBULATORY_CARE_PROVIDER_SITE_OTHER): Payer: Self-pay | Admitting: General Surgery

## 2013-07-04 NOTE — Telephone Encounter (Signed)
LMOM for patient to call back and ask for Antonio [name] or Antonio [name] for a new patient spot. Refer from Dr Oletta Lamas for anal polyp

## 2013-07-08 ENCOUNTER — Encounter: Payer: Self-pay | Admitting: Cardiology

## 2013-07-08 ENCOUNTER — Ambulatory Visit (INDEPENDENT_AMBULATORY_CARE_PROVIDER_SITE_OTHER): Payer: Medicare Other | Admitting: Cardiology

## 2013-07-08 VITALS — BP 144/70 | HR 70 | Ht 69.0 in | Wt 210.4 lb

## 2013-07-08 DIAGNOSIS — I4891 Unspecified atrial fibrillation: Secondary | ICD-10-CM

## 2013-07-08 DIAGNOSIS — Z7901 Long term (current) use of anticoagulants: Secondary | ICD-10-CM

## 2013-07-08 DIAGNOSIS — E785 Hyperlipidemia, unspecified: Secondary | ICD-10-CM

## 2013-07-08 DIAGNOSIS — I10 Essential (primary) hypertension: Secondary | ICD-10-CM

## 2013-07-08 DIAGNOSIS — I480 Paroxysmal atrial fibrillation: Secondary | ICD-10-CM

## 2013-07-08 NOTE — Progress Notes (Signed)
Antonio [name] Date of Birth: 02/16/1941   History of Present Illness: Antonio [name] is seen for followup today. He has a history of paroxysmal atrial fibrillation managed with rate control and anticoagulation. He states he is feeling well.  He denies any chest pain, palpitations, or shortness of breath. He was found to be anemic. Colonoscopy showed a "walnut sized" polyp. He is planning to have surgery.  Current Outpatient Prescriptions on File Prior to Visit  Medication Sig Dispense Refill  . acetaminophen (TYLENOL) 325 MG tablet Take 325 mg by mouth as needed.      . Ascorbic Acid (VITAMIN C) 500 MG tablet Take 1 tablet (500 mg total) by mouth daily.  30 tablet    . Coenzyme Q10 (COQ10) 200 MG CAPS Take by mouth daily.      . dabigatran (PRADAXA) 150 MG CAPS capsule Take 1 capsule (150 mg total) by mouth every 12 (twelve) hours.  60 capsule  6  . folic acid-pyridoxine-cyancobalamin (FOLBIC) 2.5-25-2 MG TABS TAKE 1 TABLET BY MOUTH EVERY DAY  30 tablet  5  . hydrochlorothiazide (MICROZIDE) 12.5 MG capsule TAKE ONE CAPSULE BY MOUTH EVERY DAY  30 capsule  5  . HYDROcodone-acetaminophen (NORCO) 5-325 MG per tablet Take 1 tablet by mouth as needed.      Marland Kitchen lisinopril (PRINIVIL,ZESTRIL) 20 MG tablet TAKE 1 TABLET BY MOUTH EVERY DAY  30 tablet  2  . simvastatin (ZOCOR) 10 MG tablet Take 10 mg by mouth at bedtime.       No current facility-administered medications on file prior to visit.    No Known Allergies  Past Medical History  Diagnosis Date  . Hypertension   . Chronic anticoagulation     Replaced with Pradaxa  . Hyperlipidemia   . CRI (chronic renal insufficiency)   . PAF (paroxysmal atrial fibrillation)   . Spinal stenosis      L4-5  . Coronary artery disease   . Hypercholesterolemia   . Elevated serum creatinine     improved with gentle hydration  . Hypotension     Postoperative hypotension improved with holding medication  . Stroke      Status post left hemisphere stroke  with right-sided weakness  . Dyslipidemia     Past Surgical History  Procedure Laterality Date  . Cholecystectomy    . Cataract extraction    . Back surgery      History  Smoking status  . Former Smoker  . Quit date: 05/16/1968  Smokeless tobacco  . Not on file    History  Alcohol Use No    Family History  Problem Relation Age of Onset  . Hypertension Mother   . Stroke Mother   . Heart disease Father   . Diabetes Father   . Diabetes Brother   . Diabetes Brother   . Cancer Father      history of prostate, bladder and colon cancer    Review of System: As noted in history of present illness.  All other systems were reviewed and are negative.  Physical Exam: BP 144/70  Pulse 70  Ht 5\' 9"  (1.753 m)  Wt 210 lb 6.4 oz (95.437 kg)  BMI 31.06 kg/m2 Patient is very pleasant and in no acute distress. Skin is warm and dry. Color is normal.  HEENT is unremarkable. Normocephalic/atraumatic. PERRL. Sclera are nonicteric. Neck is supple. No masses. No JVD. Lungs are clear. Cardiac exam shows an irregular rhythm today. Abdomen is soft. Extremities are with just  trace edema. He does have a resting tremor in his left hand. Gait and ROM are intact. No gross neurologic deficits noted.  LABORATORY DATA:   Assessment / Plan: 1.Paroxysmal Atrial fibrillation. No significant symptoms. Continue Pradaxa.  2. HTN controlled.  3. Hyperlipidemia.   [name] is low risk from a cardiac standpoint for colon surgery. He is instructed to stop Pradaxa 48 hours before surgery. I will follow up in 6 months.

## 2013-07-08 NOTE — Patient Instructions (Signed)
Continue your current therapy  I will see you in 6 months.   

## 2013-07-11 ENCOUNTER — Other Ambulatory Visit: Payer: Self-pay | Admitting: Cardiology

## 2013-07-22 ENCOUNTER — Ambulatory Visit (INDEPENDENT_AMBULATORY_CARE_PROVIDER_SITE_OTHER): Payer: Medicare Other | Admitting: General Surgery

## 2013-07-22 ENCOUNTER — Encounter (INDEPENDENT_AMBULATORY_CARE_PROVIDER_SITE_OTHER): Payer: Self-pay

## 2013-07-22 ENCOUNTER — Encounter (INDEPENDENT_AMBULATORY_CARE_PROVIDER_SITE_OTHER): Payer: Self-pay | Admitting: General Surgery

## 2013-07-22 VITALS — BP 122/60 | HR 78 | Temp 97.4°F | Resp 16 | Ht 68.0 in | Wt 211.8 lb

## 2013-07-22 DIAGNOSIS — D128 Benign neoplasm of rectum: Secondary | ICD-10-CM

## 2013-07-22 DIAGNOSIS — D129 Benign neoplasm of anus and anal canal: Secondary | ICD-10-CM

## 2013-07-22 MED ORDER — METRONIDAZOLE 500 MG PO TABS
500.0000 mg | ORAL_TABLET | Freq: Three times a day (TID) | ORAL | Status: DC
Start: 2013-07-22 — End: 2013-07-30

## 2013-07-22 NOTE — Progress Notes (Signed)
Chief Complaint  Patient presents with  . New Evaluation    rectal polyp    HISTORY: Antonio [name] is a 73 y.o. male who presents to the office with rectal polyp.  This was found on recent colonoscopy.  He denies any bleeding or changes in bowel habits.  He is on pradaxa for paroxysmal A fib.   Past Medical History  Diagnosis Date  . Hypertension   . Chronic anticoagulation     Replaced with Pradaxa  . Hyperlipidemia   . CRI (chronic renal insufficiency)   . PAF (paroxysmal atrial fibrillation)   . Spinal stenosis      L4-5  . Coronary artery disease   . Hypercholesterolemia   . Elevated serum creatinine     improved with gentle hydration  . Hypotension     Postoperative hypotension improved with holding medication  . Stroke      Status post left hemisphere stroke with right-sided weakness  . Dyslipidemia       Past Surgical History  Procedure Laterality Date  . Cholecystectomy    . Cataract extraction    . Back surgery        Current Outpatient Prescriptions  Medication Sig Dispense Refill  . acetaminophen (TYLENOL) 325 MG tablet Take 325 mg by mouth as needed.      Marland Kitchen amLODipine (NORVASC) 10 MG tablet TAKE 1/2 TABLET BY MOUTH DAILY      . amoxicillin (AMOXIL) 500 MG capsule       . Ascorbic Acid (VITAMIN C) 500 MG tablet Take 1 tablet (500 mg total) by mouth daily.  30 tablet    . clarithromycin (BIAXIN) 500 MG tablet       . Coenzyme Q10 (COQ10) 200 MG CAPS Take by mouth daily.      . dabigatran (PRADAXA) 150 MG CAPS capsule Take 1 capsule (150 mg total) by mouth every 12 (twelve) hours.  60 capsule  6  . folic acid-pyridoxine-cyancobalamin (FOLBIC) 2.5-25-2 MG TABS TAKE 1 TABLET BY MOUTH EVERY DAY  30 tablet  5  . hydrochlorothiazide (MICROZIDE) 12.5 MG capsule TAKE ONE CAPSULE BY MOUTH EVERY DAY  30 capsule  5  . lisinopril (PRINIVIL,ZESTRIL) 20 MG tablet TAKE 1 TABLET BY MOUTH EVERY DAY  30 tablet  2  . pantoprazole (PROTONIX) 40 MG tablet       . simvastatin  (ZOCOR) 10 MG tablet Take 10 mg by mouth at bedtime.      . metroNIDAZOLE (FLAGYL) 500 MG tablet Take 1 tablet (500 mg total) by mouth 3 (three) times daily.  3 tablet  0   No current facility-administered medications for this visit.      No Known Allergies    Family History  Problem Relation Age of Onset  . Hypertension Mother   . Stroke Mother   . Heart disease Mother   . Heart disease Father   . Diabetes Father   . Cancer Father      history of prostate, bladder and colon cancer  . Diabetes Brother   . Diabetes Brother       History   Social History  . Marital Status: Married    Spouse Name: N/A    Number of Children: N/A  . Years of Education: N/A   Social History Main Topics  . Smoking status: Former Smoker    Quit date: 05/16/1968  . Smokeless tobacco: Never Used  . Alcohol Use: No  . Drug Use: No  . Sexual Activity: None  Other Topics Concern  . None   Social History Narrative  . None       REVIEW OF SYSTEMS - PERTINENT POSITIVES ONLY: Review of Systems - General ROS: negative for - chills or fever Respiratory ROS: no cough, shortness of breath, or wheezing Cardiovascular ROS: no chest pain or dyspnea on exertion Gastrointestinal ROS: positive for - abdominal pain, blood in stools, change in bowel habits, change in stools, constipation and diarrhea Genito-Urinary ROS: no dysuria, trouble voiding, or hematuria  EXAM: Filed Vitals:   07/22/13 1021  BP: 122/60  Pulse: 78  Temp: 97.4 F (36.3 C)  Resp: 16    Gen:  No acute distress.  Well nourished and well groomed.   Neurological: Alert and oriented to person, place, and time. Coordination normal.  Head: Normocephalic and atraumatic.  Eyes: Conjunctivae are normal. Pupils are equal, round, and reactive to light. No scleral icterus.  Neck: Normal range of motion. Neck supple. No tracheal deviation or thyromegaly present.  No cervical lymphadenopathy. Cardiovascular: Normal rate, regular rhythm,  normal heart sounds and intact distal pulses.  Respiratory: Effort normal.  No respiratory distress. No chest wall tenderness. Breath sounds normal.  No wheezes, rales or rhonchi.  GI: Soft. Bowel sounds are normal. The abdomen is soft and nontender.  There is no rebound and no guarding.  Musculoskeletal: Normal range of motion. Extremities are nontender.  Skin: Skin is warm and dry. No rash noted. No diaphoresis. No erythema. No pallor. No clubbing, cyanosis, or edema.   Psychiatric: Normal mood and affect. Behavior is normal. Judgment and thought content normal.   Anorectal Exam: possible anterior mass palpated    ASSESSMENT AND PLAN: Antonio [name] Is a 73 year old male who presents to the office with a rectal polyp found on screening colonoscopy. On exam it appears to be anterior in the mid rectum. We discussed the risk and benefits of surgical resection versus endoscopic resection. We discussed the need for additional surgery if the tumor appears to have cancer within it. We discussed that a surgical resection does not eliminate risk of bleeding or infection. Hopefully it will reduce both of these though. We discussed there is a risk of perforation of the colon, but the risk of an emergent surgery is very low. He'll need to do a bowel prep prior to the surgery. You also need preoperative cardiac clearance and to be off of his Pradaxa.  We will plan on keeping him overnight and discharging him the following day if things go well.  The surgery and anatomy were described to the patient as well as the risks of surgery and the possible complications.  These include: Bleeding, infection and possible wound complications such as hernia, damage to adjacent structures, leak of surgical connections, which can lead to other surgeries and possibly an ostomy (1-2%), possible need for other procedures, such as abscess drains in radiology, possible prolonged hospital stay, possible diarrhea from removal of part  of the colon, possible constipation from narcotics, prolonged fatigue/weakness or appetite loss, possible early recurrence of cancer, possible complications of their medical problems such as heart disease or arrhythmias or lung problems, death (less than 1%).  I believe the patient understands and wishes to proceed with the surgery.     Rosario Adie, MD Colon and Rectal Surgery / [facility]      Visit Diagnoses: 1. Adenomatous rectal polyp     Primary Care Physician: Woody Seller, MD

## 2013-07-22 NOTE — Patient Instructions (Addendum)
[facility]  ONE-DAY (1) PRE-OP HOME COLON PREP INSTRUCTIONS: ** MIRALAX / GATORADE PREP / FLAGYL**  You must follow the instructions below carefully.  If you have questions or problems, please call and speak to someone in the clinic department at our office:   [phone].     INSTRUCTIONS: 1. Five days prior to your procedure do not eat nuts, popcorn, or fruit with seeds.  Stop all fiber supplements such as Metamucil, Citrucel, etc. 2. Two days before surgery fill the prescription at a pharmacy of your choice and purchase the additional supplies below.         MIRALAX - GATORADE -- DULCOLAX TABS -- FLEET ENEMA:   Purchase a bottle of MIRALAX  (255 gm bottle)    In addition, purchase four (4) DULCOLAX TABLETS (no prescription required- ask the pharmacist if you can't find them)   Purchase one Fleet Enema (Green and white box)    Purchase one 64 oz GATORADE.  (Do NOT purchase red Gatorade; any other flavor is acceptable) and place in refrigerator to get cold.  3.   Day Before Surgery:   6 am: Wash you abdomen with soap and repeat this on the morning of surgery and take 4 Dulcolax tablets   You may only have clear liquids (tea, coffee, juice, broth, jello, soft drinks, gummy bears).  You cannot have solid foods, cream, milk or milk products.  Drink at lease 8 ounces of liquids every hour while awake.   Take the Flagyl prescription as directed at 8 am, 2 pm and 8 pm.  It is helpful to take this with some jello instead of on an empty stomach.  Any flavor is ok, except red jello, which will cause red stools.   Mix the entire bottle of MiraLax and the Gatorade in a large container.    10:00am: Begin drinking the Gatorade mixture until gone (8 oz every 15-30 minutes).      You may suck on a lime wedge or hard candy to "freshen your palate" in between glasses   If you are a diabetic, take your blood sugar reading several time throughout the prep.  Have some juice available to take if your  sugar level gets too low   You may feel chilled while taking the prep.  Have some warm tea or broth to help warm up.   Continue clear liquids until midnight or bedtime  3. The day of your procedure:   Do not eat or drink ANYTHING after midnight before your surgery.     If you take Heart or Blood Pressure medicine, ask the pre-op nurses about these during your preop appointment.   Take a regular Fleet Enema one hour before leaving the come to the hospital.  Try to hold it in 5-10 mins before expelling.   Further pre-operative instructions will be given to you from the hospital.   Expect to be contacted 5-7 days before your surgery.     

## 2013-07-24 ENCOUNTER — Telehealth: Payer: Self-pay

## 2013-07-24 NOTE — Telephone Encounter (Signed)
Received surgical clearance form from [facility] stated in patient's last office visit 07/08/13 patient is low risk from a cardiac standpoint for colon surgery.He instructed patient to stop Pradaxa 48 hrs before surgery.Note faxed to fax # [phone].

## 2013-07-28 ENCOUNTER — Other Ambulatory Visit (INDEPENDENT_AMBULATORY_CARE_PROVIDER_SITE_OTHER): Payer: Self-pay | Admitting: General Surgery

## 2013-07-30 ENCOUNTER — Encounter (HOSPITAL_COMMUNITY): Payer: Self-pay | Admitting: Pharmacy Technician

## 2013-07-30 ENCOUNTER — Other Ambulatory Visit (HOSPITAL_COMMUNITY): Payer: Self-pay | Admitting: General Surgery

## 2013-07-30 NOTE — Progress Notes (Signed)
Cardiac clearance note dr Jyl Heinz 07-08-13 epic ekg 12-05-12 epic

## 2013-07-30 NOTE — Patient Instructions (Addendum)
[place]  07/30/2013   Your procedure is scheduled on: Monday March 23rd  Report to [facility] at 1100 AM.  Call this number if you have problems the morning of surgery [phone]   Remember:  Do not eat food  :After Midnight.              Clear liquids midnight until 730 am day of surgery, then nothing by mouth.     Take these medicines the morning of surgery with A SIP OF WATER: amlodpine (norvasc), pantaprazole (protonix)                                SEE [place] PREPARING FOR SURGERY SHEET             You may not have any metal on your body including hair pins and piercings  Do not wear jewelry, make-up.  Do not wear lotions, powders, or perfumes. No  Deodorant is to be worn.   Men may shave face and neck.  Do not bring valuables to the hospital. [place].  Contacts, dentures or bridgework may not be worn into surgery.  Leave suitcase in the car. After surgery it may be brought to your room.  For patients admitted to the hospital, checkout time is 11:00 AM the day of discharge.    Please read over the following fact sheets that you were given: [facility] Preparing for surgery sheet.  Call Zelphia Cairo RN pre op nurse if needed [phone]    FAILURE TO FOLLOW THESE INSTRUCTIONS MAY RESULT IN THE CANCELLATION OF YOUR SURGERY.  PATIENT SIGNATURE___________________________________________  NURSE SIGNATURE_____________________________________________

## 2013-08-01 ENCOUNTER — Ambulatory Visit (HOSPITAL_COMMUNITY)
Admission: RE | Admit: 2013-08-01 | Discharge: 2013-08-01 | Disposition: A | Discharge status: Home / Self Care | Payer: Medicare Other | Source: Ambulatory Visit | Attending: General Surgery | Admitting: General Surgery

## 2013-08-01 ENCOUNTER — Encounter (INDEPENDENT_AMBULATORY_CARE_PROVIDER_SITE_OTHER): Payer: Self-pay

## 2013-08-01 ENCOUNTER — Encounter (HOSPITAL_COMMUNITY)
Admission: RE | Admit: 2013-08-01 | Discharge: 2013-08-01 | Disposition: A | Discharge status: Home / Self Care | Payer: Medicare Other | Source: Ambulatory Visit | Attending: General Surgery | Admitting: General Surgery

## 2013-08-01 ENCOUNTER — Encounter (HOSPITAL_COMMUNITY): Payer: Self-pay

## 2013-08-01 DIAGNOSIS — Z01812 Encounter for preprocedural laboratory examination: Secondary | ICD-10-CM | POA: Insufficient documentation

## 2013-08-01 DIAGNOSIS — Z01818 Encounter for other preprocedural examination: Secondary | ICD-10-CM | POA: Insufficient documentation

## 2013-08-01 LAB — CBC
HCT: 31.3 % — ABNORMAL LOW (ref 39.0–52.0)
Hemoglobin: 10 g/dL — ABNORMAL LOW (ref 13.0–17.0)
MCH: 23.6 pg — AB (ref 26.0–34.0)
MCHC: 31.9 g/dL (ref 30.0–36.0)
MCV: 74 fL — AB (ref 78.0–100.0)
Platelets: 291 10*3/uL (ref 150–400)
RBC: 4.23 MIL/uL (ref 4.22–5.81)
RDW: 16.4 % — ABNORMAL HIGH (ref 11.5–15.5)
WBC: 8.7 10*3/uL (ref 4.0–10.5)

## 2013-08-01 LAB — BASIC METABOLIC PANEL
BUN: 38 mg/dL — ABNORMAL HIGH (ref 6–23)
CO2: 20 meq/L (ref 19–32)
Calcium: 8.8 mg/dL (ref 8.4–10.5)
Chloride: 106 mEq/L (ref 96–112)
Creatinine, Ser: 2.22 mg/dL — ABNORMAL HIGH (ref 0.50–1.35)
GFR calc Af Amer: 32 mL/min — ABNORMAL LOW (ref >90)
GFR calc non Af Amer: 28 mL/min — ABNORMAL LOW (ref >90)
GLUCOSE: 121 mg/dL — AB (ref 70–99)
POTASSIUM: 4.4 meq/L (ref 3.7–5.3)
SODIUM: 139 meq/L (ref 137–147)

## 2013-08-01 NOTE — Progress Notes (Signed)
Cbc with dif and bmet results reviewed by dr Marcello Moores per epic 08-01-2013 1109 am

## 2013-08-04 ENCOUNTER — Other Ambulatory Visit: Payer: Self-pay | Admitting: Cardiology

## 2013-08-05 ENCOUNTER — Encounter (HOSPITAL_COMMUNITY): Payer: Medicare Other | Admitting: Anesthesiology

## 2013-08-05 ENCOUNTER — Encounter (HOSPITAL_COMMUNITY): Payer: Self-pay | Admitting: *Deleted

## 2013-08-05 ENCOUNTER — Ambulatory Visit (HOSPITAL_COMMUNITY)
Admission: RE | Admit: 2013-08-05 | Discharge: 2013-08-05 | Disposition: A | Discharge status: Home / Self Care | Payer: Medicare Other | Source: Ambulatory Visit | Attending: General Surgery | Admitting: General Surgery

## 2013-08-05 ENCOUNTER — Other Ambulatory Visit: Payer: Self-pay | Admitting: Cardiology

## 2013-08-05 ENCOUNTER — Encounter (HOSPITAL_COMMUNITY)
Admission: RE | Disposition: A | Discharge status: Home / Self Care | Payer: Self-pay | Source: Ambulatory Visit | Attending: General Surgery

## 2013-08-05 ENCOUNTER — Ambulatory Visit (HOSPITAL_COMMUNITY): Payer: Medicare Other | Admitting: Anesthesiology

## 2013-08-05 DIAGNOSIS — Z7901 Long term (current) use of anticoagulants: Secondary | ICD-10-CM | POA: Insufficient documentation

## 2013-08-05 DIAGNOSIS — D128 Benign neoplasm of rectum: Secondary | ICD-10-CM | POA: Insufficient documentation

## 2013-08-05 DIAGNOSIS — Z87891 Personal history of nicotine dependence: Secondary | ICD-10-CM | POA: Insufficient documentation

## 2013-08-05 DIAGNOSIS — E785 Hyperlipidemia, unspecified: Secondary | ICD-10-CM | POA: Insufficient documentation

## 2013-08-05 DIAGNOSIS — I69998 Other sequelae following unspecified cerebrovascular disease: Secondary | ICD-10-CM | POA: Insufficient documentation

## 2013-08-05 DIAGNOSIS — D378 Neoplasm of uncertain behavior of other specified digestive organs: Secondary | ICD-10-CM

## 2013-08-05 DIAGNOSIS — D371 Neoplasm of uncertain behavior of stomach: Secondary | ICD-10-CM

## 2013-08-05 DIAGNOSIS — I4891 Unspecified atrial fibrillation: Secondary | ICD-10-CM | POA: Insufficient documentation

## 2013-08-05 DIAGNOSIS — R5383 Other fatigue: Secondary | ICD-10-CM

## 2013-08-05 DIAGNOSIS — Z9089 Acquired absence of other organs: Secondary | ICD-10-CM | POA: Insufficient documentation

## 2013-08-05 DIAGNOSIS — R5381 Other malaise: Secondary | ICD-10-CM | POA: Insufficient documentation

## 2013-08-05 DIAGNOSIS — I2510 Atherosclerotic heart disease of native coronary artery without angina pectoris: Secondary | ICD-10-CM | POA: Insufficient documentation

## 2013-08-05 DIAGNOSIS — D375 Neoplasm of uncertain behavior of rectum: Secondary | ICD-10-CM

## 2013-08-05 DIAGNOSIS — N189 Chronic kidney disease, unspecified: Secondary | ICD-10-CM | POA: Insufficient documentation

## 2013-08-05 DIAGNOSIS — I129 Hypertensive chronic kidney disease with stage 1 through stage 4 chronic kidney disease, or unspecified chronic kidney disease: Secondary | ICD-10-CM | POA: Insufficient documentation

## 2013-08-05 DIAGNOSIS — E7800 Pure hypercholesterolemia, unspecified: Secondary | ICD-10-CM | POA: Insufficient documentation

## 2013-08-05 DIAGNOSIS — D129 Benign neoplasm of anus and anal canal: Principal | ICD-10-CM

## 2013-08-05 DIAGNOSIS — Z79899 Other long term (current) drug therapy: Secondary | ICD-10-CM | POA: Insufficient documentation

## 2013-08-05 HISTORY — PX: PARTIAL PROCTECTOMY BY TEM: SHX6011

## 2013-08-05 SURGERY — PARTIAL PROCTECTOMY BY TEM
Anesthesia: General | Site: Rectum

## 2013-08-05 MED ORDER — 0.9 % SODIUM CHLORIDE (POUR BTL) OPTIME
TOPICAL | Status: DC | PRN
  Administered 2013-08-05: 1000 mL

## 2013-08-05 MED ORDER — ONDANSETRON HCL 4 MG/2ML IJ SOLN
INTRAMUSCULAR | Status: DC | PRN
  Administered 2013-08-05: 4 mg via INTRAVENOUS

## 2013-08-05 MED ORDER — HYDROMORPHONE HCL PF 1 MG/ML IJ SOLN: 0.2500 mg | INTRAMUSCULAR | Status: DC | PRN

## 2013-08-05 MED ORDER — OXYCODONE HCL 5 MG PO TABS: 5.0000 mg | ORAL_TABLET | Freq: Once | ORAL | Status: DC | PRN

## 2013-08-05 MED ORDER — PROPOFOL 10 MG/ML IV BOLUS
INTRAVENOUS | Status: DC | PRN
  Administered 2013-08-05: 180 mg via INTRAVENOUS

## 2013-08-05 MED ORDER — EPHEDRINE SULFATE 50 MG/ML IJ SOLN
INTRAMUSCULAR | Status: DC | PRN
  Administered 2013-08-05: 10 mg via INTRAVENOUS

## 2013-08-05 MED ORDER — SODIUM CHLORIDE 0.9 % IV SOLN
250.0000 mL | INTRAVENOUS | Status: DC | PRN
  Administered 2013-08-05: 250 mL via INTRAVENOUS

## 2013-08-05 MED ORDER — BUPIVACAINE-EPINEPHRINE 0.25% -1:200000 IJ SOLN
INTRAMUSCULAR | Status: AC
Start: 2013-08-05 — End: 2013-08-05
  Filled 2013-08-05: qty 1

## 2013-08-05 MED ORDER — ROCURONIUM BROMIDE 100 MG/10ML IV SOLN
INTRAVENOUS | Status: AC
  Filled 2013-08-05: qty 1

## 2013-08-05 MED ORDER — SODIUM CHLORIDE 0.9 % IJ SOLN
INTRAMUSCULAR | Status: AC
  Filled 2013-08-05: qty 10

## 2013-08-05 MED ORDER — MIDAZOLAM HCL 2 MG/2ML IJ SOLN
INTRAMUSCULAR | Status: AC
  Filled 2013-08-05: qty 2

## 2013-08-05 MED ORDER — SODIUM CHLORIDE 0.9 % IJ SOLN: 3.0000 mL | Freq: Two times a day (BID) | INTRAMUSCULAR | Status: DC

## 2013-08-05 MED ORDER — FENTANYL CITRATE 0.05 MG/ML IJ SOLN
INTRAMUSCULAR | Status: DC | PRN
  Administered 2013-08-05: 50 ug via INTRAVENOUS
  Administered 2013-08-05: 100 ug via INTRAVENOUS

## 2013-08-05 MED ORDER — LIDOCAINE HCL (CARDIAC) 20 MG/ML IV SOLN
INTRAVENOUS | Status: AC
  Filled 2013-08-05: qty 5

## 2013-08-05 MED ORDER — ONDANSETRON HCL 4 MG/2ML IJ SOLN
INTRAMUSCULAR | Status: AC
  Filled 2013-08-05: qty 2

## 2013-08-05 MED ORDER — GLYCOPYRROLATE 0.2 MG/ML IJ SOLN
INTRAMUSCULAR | Status: DC | PRN
  Administered 2013-08-05: 0.6 mg via INTRAVENOUS

## 2013-08-05 MED ORDER — ERTAPENEM SODIUM 1 G IJ SOLR
1.0000 g | INTRAMUSCULAR | Status: AC
  Administered 2013-08-05: 1 g via INTRAVENOUS

## 2013-08-05 MED ORDER — SODIUM CHLORIDE 0.9 % IV SOLN
INTRAVENOUS | Status: AC
  Filled 2013-08-05: qty 1

## 2013-08-05 MED ORDER — NEOSTIGMINE METHYLSULFATE 1 MG/ML IJ SOLN
INTRAMUSCULAR | Status: DC | PRN
  Administered 2013-08-05 (×2): 4 mg via INTRAVENOUS

## 2013-08-05 MED ORDER — PROMETHAZINE HCL 25 MG/ML IJ SOLN: 6.2500 mg | INTRAMUSCULAR | Status: DC | PRN

## 2013-08-05 MED ORDER — FENTANYL CITRATE 0.05 MG/ML IJ SOLN
INTRAMUSCULAR | Status: AC
  Filled 2013-08-05: qty 5

## 2013-08-05 MED ORDER — MIDAZOLAM HCL 5 MG/5ML IJ SOLN
INTRAMUSCULAR | Status: DC | PRN
  Administered 2013-08-05 (×2): 1 mg via INTRAVENOUS

## 2013-08-05 MED ORDER — PROPOFOL 10 MG/ML IV BOLUS
INTRAVENOUS | Status: AC
Start: 2013-08-05 — End: 2013-08-05
  Filled 2013-08-05: qty 20

## 2013-08-05 MED ORDER — SODIUM CHLORIDE 0.9 % IJ SOLN: 3.0000 mL | INTRAMUSCULAR | Status: DC | PRN

## 2013-08-05 MED ORDER — BUPIVACAINE-EPINEPHRINE 0.25% -1:200000 IJ SOLN
INTRAMUSCULAR | Status: DC | PRN
  Administered 2013-08-05: 20 mL

## 2013-08-05 MED ORDER — CISATRACURIUM BESYLATE 20 MG/10ML IV SOLN
INTRAVENOUS | Status: AC
  Filled 2013-08-05: qty 10

## 2013-08-05 MED ORDER — SUCCINYLCHOLINE CHLORIDE 20 MG/ML IJ SOLN
INTRAMUSCULAR | Status: DC | PRN
  Administered 2013-08-05: 100 mg via INTRAVENOUS

## 2013-08-05 MED ORDER — SUFENTANIL CITRATE 50 MCG/ML IV SOLN
INTRAVENOUS | Status: AC
  Filled 2013-08-05: qty 1

## 2013-08-05 MED ORDER — LIDOCAINE HCL (CARDIAC) 20 MG/ML IV SOLN
INTRAVENOUS | Status: DC | PRN
Start: 2013-08-05 — End: 2013-08-05
  Administered 2013-08-05: 100 mg via INTRAVENOUS

## 2013-08-05 MED ORDER — GLYCOPYRROLATE 0.2 MG/ML IJ SOLN
INTRAMUSCULAR | Status: AC
  Filled 2013-08-05: qty 1

## 2013-08-05 MED ORDER — OXYCODONE HCL 5 MG/5ML PO SOLN
5.0000 mg | Freq: Once | ORAL | Status: DC | PRN
  Filled 2013-08-05: qty 5

## 2013-08-05 MED ORDER — LACTATED RINGERS IR SOLN
Status: DC | PRN
  Administered 2013-08-05: 1000 mL

## 2013-08-05 MED ORDER — PROPOFOL 10 MG/ML IV BOLUS
INTRAVENOUS | Status: AC
  Filled 2013-08-05: qty 20

## 2013-08-05 MED ORDER — DEXAMETHASONE SODIUM PHOSPHATE 10 MG/ML IJ SOLN
INTRAMUSCULAR | Status: AC
  Filled 2013-08-05: qty 1

## 2013-08-05 MED ORDER — MEPERIDINE HCL 50 MG/ML IJ SOLN: 6.2500 mg | INTRAMUSCULAR | Status: DC | PRN

## 2013-08-05 MED ORDER — LACTATED RINGERS IV SOLN
INTRAVENOUS | Status: DC | PRN
  Administered 2013-08-05 (×2): via INTRAVENOUS

## 2013-08-05 MED ORDER — ROCURONIUM BROMIDE 100 MG/10ML IV SOLN
INTRAVENOUS | Status: DC | PRN
  Administered 2013-08-05: 20 mg via INTRAVENOUS
  Administered 2013-08-05: 10 mg via INTRAVENOUS
  Administered 2013-08-05: 20 mg via INTRAVENOUS

## 2013-08-05 SURGICAL SUPPLY — 70 items
BLADE SURG SZ10 CARB STEEL (BLADE) IMPLANT
BRIEF STRETCH FOR OB PAD LRG (UNDERPADS AND DIAPERS) IMPLANT
CATH FOLEY 3WAY 30CC 26FR (CATHETERS) IMPLANT
CATH FOLEY SILVER 30CC 26FR (CATHETERS) IMPLANT
CLIP SUT LAPRA TY ABSORB (SUTURE) IMPLANT
COVER MAYO STAND STRL (DRAPES) IMPLANT
DEVICE SUT QUICK LOAD TK 5 (STAPLE) IMPLANT
DEVICE SUT TI-KNOT TK 5X26 (MISCELLANEOUS) IMPLANT
DEVICE SUTURE ENDOST 10MM (ENDOMECHANICALS) IMPLANT
DEVICE TI KNOT TK5 (MISCELLANEOUS)
DRAPE C-ARM 42X120 X-RAY (DRAPES) IMPLANT
DRAPE CAMERA CLOSED 9X96 (DRAPES) ×3 IMPLANT
DRAPE LAPAROSCOPIC ABDOMINAL (DRAPES) IMPLANT
DRAPE LAPAROTOMY T 102X78X121 (DRAPES) IMPLANT
DRAPE LG THREE QUARTER DISP (DRAPES) IMPLANT
DRAPE WARM FLUID 44X44 (DRAPE) IMPLANT
ELECT BLADE 6.5 EXT (BLADE) IMPLANT
ELECT REM PT RETURN 9FT ADLT (ELECTROSURGICAL) ×3
ELECTRODE REM PT RTRN 9FT ADLT (ELECTROSURGICAL) ×1 IMPLANT
GAUZE SPONGE 4X4 16PLY XRAY LF (GAUZE/BANDAGES/DRESSINGS) IMPLANT
GLOVE ECLIPSE 8.0 STRL XLNG CF (GLOVE) ×3 IMPLANT
GLOVE INDICATOR 8.0 STRL GRN (GLOVE) ×6 IMPLANT
GOWN STRL REUS W/TWL LRG LVL3 (GOWN DISPOSABLE) ×3 IMPLANT
GOWN STRL REUS W/TWL XL LVL3 (GOWN DISPOSABLE) ×6 IMPLANT
HEMOSTAT SURGICEL 4X8 (HEMOSTASIS) IMPLANT
IV LACTATED RINGERS 1000ML (IV SOLUTION) IMPLANT
KIT BASIN OR (CUSTOM PROCEDURE TRAY) ×3 IMPLANT
LEGGING LITHOTOMY PAIR STRL (DRAPES) ×3 IMPLANT
LUBRICANT JELLY K Y 4OZ (MISCELLANEOUS) ×3 IMPLANT
NEEDLE HYPO 22GX1.5 SAFETY (NEEDLE) IMPLANT
NEEDLE INSUFFLATION 14GA 120MM (NEEDLE) IMPLANT
NS IRRIG 1000ML POUR BTL (IV SOLUTION) ×3 IMPLANT
PACK GENERAL/GYN (CUSTOM PROCEDURE TRAY) IMPLANT
PENCIL BUTTON HOLSTER BLD 10FT (ELECTRODE) IMPLANT
QUICK LOAD TK 5 (STAPLE)
RELOAD ENDO STITCH 2.0 (ENDOMECHANICALS)
RETRACTOR STAY HOOK 5MM (MISCELLANEOUS) IMPLANT
RETRACTOR WILSON SYSTEM (INSTRUMENTS) IMPLANT
SCALPEL HARMONIC ACE (MISCELLANEOUS) ×3 IMPLANT
SCISSORS LAP 5X35 DISP (ENDOMECHANICALS) IMPLANT
SPONGE GAUZE 4X4 12PLY (GAUZE/BANDAGES/DRESSINGS) ×3 IMPLANT
SPONGE HEMORRHOID 8X3CM (HEMOSTASIS) IMPLANT
SPONGE LAP 18X18 X RAY DECT (DISPOSABLE) ×3 IMPLANT
SPONGE SURGIFOAM ABS GEL 100 (HEMOSTASIS) IMPLANT
STOPCOCK K 69 2C6206 (IV SETS) ×3 IMPLANT
SUCTION POOLE TIP (SUCTIONS) IMPLANT
SUT PDS AB 2-0 CT2 27 (SUTURE) IMPLANT
SUT PDS AB 3-0 SH 27 (SUTURE) IMPLANT
SUT RELOAD ENDO STITCH 2 48X1 (ENDOMECHANICALS)
SUT SILK 2 0 (SUTURE)
SUT SILK 2 0 SH CR/8 (SUTURE) IMPLANT
SUT SILK 2-0 18XBRD TIE 12 (SUTURE) IMPLANT
SUT SILK 3 0 SH 30 (SUTURE) IMPLANT
SUT SILK 3 0 SH CR/8 (SUTURE) IMPLANT
SUT VIC AB 2-0 UR6 27 (SUTURE) IMPLANT
SUT VIC AB 3-0 SH 18 (SUTURE) IMPLANT
SUT VIC AB 3-0 SH 27 (SUTURE)
SUT VIC AB 3-0 SH 27XBRD (SUTURE) IMPLANT
SUT VIC AB 4-0 SH 18 (SUTURE) IMPLANT
SUTURE RELOAD END STTCH 2 48X1 (ENDOMECHANICALS) IMPLANT
SYR 20CC LL (SYRINGE) IMPLANT
SYR 30ML LL (SYRINGE) IMPLANT
SYR BULB IRRIGATION 50ML (SYRINGE) IMPLANT
SYRINGE IRR TOOMEY STRL 70CC (SYRINGE) IMPLANT
TOWEL OR 17X26 10 PK STRL BLUE (TOWEL DISPOSABLE) ×3 IMPLANT
TRAY FOLEY CATH 14FRSI W/METER (CATHETERS) IMPLANT
TUBING CONNECTING 10 (TUBING) IMPLANT
TUBING CONNECTING 10' (TUBING)
YANKAUER SUCT BULB TIP 10FT TU (MISCELLANEOUS) IMPLANT
YANKAUER SUCT BULB TIP NO VENT (SUCTIONS) IMPLANT

## 2013-08-05 NOTE — Op Note (Signed)
08/05/2013  3:08 PM  PATIENT:  Antonio [name]  73 y.o. male  Patient Care Team: Christain Sacramento, MD as PCP - General (Family Medicine)  PRE-OPERATIVE DIAGNOSIS:  MID RECTAL POLYP  POST-OPERATIVE DIAGNOSIS:  Mid rectal polyp   PROCEDURE:  rigid proctoscopy, flexible sigmoidoscopy, TEM polyp resection  SURGEON:  Surgeon(s): Leighton Ruff, MD Adin Hector, MD  ASSISTANT: Johney Maine   ANESTHESIA:   general  EBL:  Total I/O In: 1000 [I.V.:1000] Out: 225 [Urine:225]  Delay start of Pharmacological VTE agent (>24hrs) due to surgical blood loss or risk of bleeding:  not applicable  DRAINS: none   SPECIMEN:  Source of Specimen:  mid rectal polyp (pedunculated)  DISPOSITION OF SPECIMEN:  PATHOLOGY  COUNTS:  YES  PLAN OF CARE: Admit to inpatient   PATIENT DISPOSITION:  PACU - hemodynamically stable.  INDICATION: This is a 73 y.o. M with mid rectal polyp unresectable by endoscopic means.  He was sent to me for evaluation of TEM resection.   The anatomy & physiology of the digestive tract was discussed.  The pathophysiology of the rectal pathology was discussed.  Natural history risks without surgery was discussed.   I feel the risks of no intervention will lead to serious problems that outweigh the operative risks; therefore, I recommended surgery.    Laparoscopic & open abdominal techniques were discussed.  I recommended we start with a partial proctectomy by transanal endoscopic microsurgery (TEM) for excisional biopsy to remove the pathology and hopefully cure and/or control the pathology.  This technique can offer less operative risk and faster post-operative recovery.  Possible need for immediate or later abdominal surgery for further treatment was discussed.   Risks such as bleeding, abscess, reoperation, ostomy, heart attack, death, and other risks were discussed.   I noted a good likelihood this will help address the problem.  Goals of post-operative recovery were discussed as  well.  We will work to minimize complications.  An educational handout was given as well.  Questions were answered.  The patient expresses understanding & wishes to proceed with surgery.  OR FINDINGS: mid rectal polyp ~10 cm from anal verge on a small stalk  DESCRIPTION: Informed consent was confirmed.  Patient received general anesthesia without difficulty.  Foley catheter sterilely placed.  Sequential compression devices active during the entire case.  The patient was placed in the supine position, taking extra care to secure and protect the patient appropriately.  The perineum and perianal regions were prepped and draped in a sterile fashion.  Surgical timeout confirmed our plan.  I did a gentle digital rectal examination with gradual anal dilation to allow placement of the 4 cm TEO Stortz scope.  This was secured to the bed using the TEO clamping system.  We induced carbon dioxide insufflation intraluminally.  I oriented the Stortz scope and the patient such that the specimen rested towards the floor at the 6:00 position.  DESCRIPTION: Informed consent was confirmed.  Patient received general anesthesia without difficulty.  Foley catheter sterilely placed.  Sequential compression devices active during the entire case.  The patient was placed in the prone position, taking extra care to secure and protect the patient appropriately.  The perineum and perianal regions were prepped and draped in a sterile fashion.  Surgical timeout confirmed our plan.  I did a gentle digital rectal examination with gradual anal dilation to allow placement of the 4 cm TEO Stortz scope.  This was secured to the bed using the TEO clamping  system.  We induced carbon dioxide insufflation intraluminally.  I oriented the Stortz scope and the patient such that the specimen rested towards the floor at the 6:00 position.  I could easily identify the mass. I then transected the stalk with a harmonic scalpel.  The resected area was  on the R lateral sidewall and ~2-3 mm in size.  Hemostasis was good.  Carbo dioxide evacuated.  The patient is being extubated go to recovery room.   I am about to discussed operative findings with the patient's family.

## 2013-08-05 NOTE — H&P (View-Only) (Signed)
Chief Complaint  Patient presents with  . New Evaluation    rectal polyp    HISTORY: [name] is a 73 y.o. male who presents to the office with rectal polyp.  This was found on recent colonoscopy.  He denies any bleeding or changes in bowel habits.  He is on pradaxa for paroxysmal A fib.   Past Medical History  Diagnosis Date  . Hypertension   . Chronic anticoagulation     Replaced with Pradaxa  . Hyperlipidemia   . CRI (chronic renal insufficiency)   . PAF (paroxysmal atrial fibrillation)   . Spinal stenosis      L4-5  . Coronary artery disease   . Hypercholesterolemia   . Elevated serum creatinine     improved with gentle hydration  . Hypotension     Postoperative hypotension improved with holding medication  . Stroke      Status post left hemisphere stroke with right-sided weakness  . Dyslipidemia       Past Surgical History  Procedure Laterality Date  . Cholecystectomy    . Cataract extraction    . Back surgery        Current Outpatient Prescriptions  Medication Sig Dispense Refill  . acetaminophen (TYLENOL) 325 MG tablet Take 325 mg by mouth as needed.      Marland Kitchen amLODipine (NORVASC) 10 MG tablet TAKE 1/2 TABLET BY MOUTH DAILY      . amoxicillin (AMOXIL) 500 MG capsule       . Ascorbic Acid (VITAMIN C) 500 MG tablet Take 1 tablet (500 mg total) by mouth daily.  30 tablet    . clarithromycin (BIAXIN) 500 MG tablet       . Coenzyme Q10 (COQ10) 200 MG CAPS Take by mouth daily.      . dabigatran (PRADAXA) 150 MG CAPS capsule Take 1 capsule (150 mg total) by mouth every 12 (twelve) hours.  60 capsule  6  . folic acid-pyridoxine-cyancobalamin (FOLBIC) 2.5-25-2 MG TABS TAKE 1 TABLET BY MOUTH EVERY DAY  30 tablet  5  . hydrochlorothiazide (MICROZIDE) 12.5 MG capsule TAKE ONE CAPSULE BY MOUTH EVERY DAY  30 capsule  5  . lisinopril (PRINIVIL,ZESTRIL) 20 MG tablet TAKE 1 TABLET BY MOUTH EVERY DAY  30 tablet  2  . pantoprazole (PROTONIX) 40 MG tablet       . simvastatin  (ZOCOR) 10 MG tablet Take 10 mg by mouth at bedtime.      . metroNIDAZOLE (FLAGYL) 500 MG tablet Take 1 tablet (500 mg total) by mouth 3 (three) times daily.  3 tablet  0   No current facility-administered medications for this visit.      No Known Allergies    Family History  Problem Relation Age of Onset  . Hypertension Mother   . Stroke Mother   . Heart disease Mother   . Heart disease Father   . Diabetes Father   . Cancer Father      history of prostate, bladder and colon cancer  . Diabetes Brother   . Diabetes Brother       History   Social History  . Marital Status: Married    Spouse Name: N/A    Number of Children: N/A  . Years of Education: N/A   Social History Main Topics  . Smoking status: Former Smoker    Quit date: 05/16/1968  . Smokeless tobacco: Never Used  . Alcohol Use: No  . Drug Use: No  . Sexual Activity: None  Other Topics Concern  . None   Social History Narrative  . None       REVIEW OF SYSTEMS - PERTINENT POSITIVES ONLY: Review of Systems - General ROS: negative for - chills or fever Respiratory ROS: no cough, shortness of breath, or wheezing Cardiovascular ROS: no chest pain or dyspnea on exertion Gastrointestinal ROS: positive for - abdominal pain, blood in stools, change in bowel habits, change in stools, constipation and diarrhea Genito-Urinary ROS: no dysuria, trouble voiding, or hematuria  EXAM: Filed Vitals:   07/22/13 1021  BP: 122/60  Pulse: 78  Temp: 97.4 F (36.3 C)  Resp: 16    Gen:  No acute distress.  Well nourished and well groomed.   Neurological: Alert and oriented to person, place, and time. Coordination normal.  Head: Normocephalic and atraumatic.  Eyes: Conjunctivae are normal. Pupils are equal, round, and reactive to light. No scleral icterus.  Neck: Normal range of motion. Neck supple. No tracheal deviation or thyromegaly present.  No cervical lymphadenopathy. Cardiovascular: Normal rate, regular rhythm,  normal heart sounds and intact distal pulses.  Respiratory: Effort normal.  No respiratory distress. No chest wall tenderness. Breath sounds normal.  No wheezes, rales or rhonchi.  GI: Soft. Bowel sounds are normal. The abdomen is soft and nontender.  There is no rebound and no guarding.  Musculoskeletal: Normal range of motion. Extremities are nontender.  Skin: Skin is warm and dry. No rash noted. No diaphoresis. No erythema. No pallor. No clubbing, cyanosis, or edema.   Psychiatric: Normal mood and affect. Behavior is normal. Judgment and thought content normal.   Anorectal Exam: possible anterior mass palpated    ASSESSMENT AND PLAN: Antonio [name] Is a 73 year old male who presents to the office with a rectal polyp found on screening colonoscopy. On exam it appears to be anterior in the mid rectum. We discussed the risk and benefits of surgical resection versus endoscopic resection. We discussed the need for additional surgery if the tumor appears to have cancer within it. We discussed that a surgical resection does not eliminate risk of bleeding or infection. Hopefully it will reduce both of these though. We discussed there is a risk of perforation of the colon, but the risk of an emergent surgery is very low. He'll need to do a bowel prep prior to the surgery. You also need preoperative cardiac clearance and to be off of his Pradaxa.  We will plan on keeping him overnight and discharging him the following day if things go well.  The surgery and anatomy were described to the patient as well as the risks of surgery and the possible complications.  These include: Bleeding, infection and possible wound complications such as hernia, damage to adjacent structures, leak of surgical connections, which can lead to other surgeries and possibly an ostomy (1-2%), possible need for other procedures, such as abscess drains in radiology, possible prolonged hospital stay, possible diarrhea from removal of part  of the colon, possible constipation from narcotics, prolonged fatigue/weakness or appetite loss, possible early recurrence of cancer, possible complications of their medical problems such as heart disease or arrhythmias or lung problems, death (less than 1%).  I believe the patient understands and wishes to proceed with the surgery.     Rosario Adie, MD Colon and Rectal Surgery / [facility]      Visit Diagnoses: 1. Adenomatous rectal polyp     Primary Care Physician: Woody Seller, MD

## 2013-08-05 NOTE — Transfer of Care (Signed)
Immediate Anesthesia Transfer of Care Note  Patient: Antonio [name]  Procedure(s) Performed: Procedure(s): rigid proctoscopy, flexible sigmoidoscopy, proctectomy by tem  (N/A)  Patient Location: PACU  Anesthesia Type:General  Level of Consciousness: awake, sedated and patient cooperative  Airway & Oxygen Therapy: Patient Spontanous Breathing and Patient connected to face mask oxygen  Post-op Assessment: Report given to PACU RN and Post -op Vital signs reviewed and stable  Post vital signs: Reviewed and stable  Complications: No apparent anesthesia complications

## 2013-08-05 NOTE — Progress Notes (Signed)
Patient from PACU to Short Stay at 1640. VSS. Drinking well. Ambulated hall with family and RN. Does not feel urge to urinate. Foley removed at the end of OR case. Bladder scanned at 1900 for 89cc. Has passed flatus. Patient had bowel prep yesterday pre op and feels he is dehydrated. No c/o pain. VSS. Currently drinking coffee and IV fluid running. Family at bedside. Continue to monitor.

## 2013-08-05 NOTE — Interval H&P Note (Signed)
History and Physical Interval Note:  08/05/2013 12:59 PM  Antonio [name]  has presented today for surgery, with the diagnosis of RECTAL POLYP  The various methods of treatment have been discussed with the patient and family. After consideration of risks, benefits and other options for treatment, the patient has consented to  Procedure(s): rigid proctoscopy possible TEM  (N/A) as a surgical intervention .  The patient's history has been reviewed, patient examined, no change in status, stable for surgery.  I have reviewed the patient's chart and labs.  Questions were answered to the patient's satisfaction.     Rosario Adie, MD  [facility] and [facility]

## 2013-08-05 NOTE — Anesthesia Preprocedure Evaluation (Addendum)
Anesthesia Evaluation  Patient identified by MRN, date of birth, ID band Patient awake    Reviewed: Allergy & Precautions, H&P , NPO status , Patient's Chart, lab work & pertinent test results  Airway Mallampati: II TM Distance: >3 FB Neck ROM: Full    Dental  (+) Dental Advisory Given   Pulmonary neg pulmonary ROS, former smoker,          Cardiovascular hypertension, Pt. on medications + CAD + dysrhythmias Atrial Fibrillation Rhythm:Regular Rate:Normal     Neuro/Psych CVA, Residual Symptoms negative psych ROS   GI/Hepatic negative GI ROS, Neg liver ROS,   Endo/Other  negative endocrine ROS  Renal/GU CRF and Renal InsufficiencyRenal disease     Musculoskeletal negative musculoskeletal ROS (+)   Abdominal (+) + obese,   Peds  Hematology negative hematology ROS (+)   Anesthesia Other Findings   Reproductive/Obstetrics negative OB ROS                        Anesthesia Physical Anesthesia Plan  ASA: III  Anesthesia Plan: General   Post-op Pain Management:    Induction: Intravenous  Airway Management Planned: Oral ETT  Additional Equipment:   Intra-op Plan:   Post-operative Plan: Extubation in OR  Informed Consent: I have reviewed the patients History and Physical, chart, labs and discussed the procedure including the risks, benefits and alternatives for the proposed anesthesia with the patient or authorized representative who has indicated his/her understanding and acceptance.   Dental advisory given  Plan Discussed with: CRNA  Anesthesia Plan Comments:        Anesthesia Quick Evaluation

## 2013-08-05 NOTE — Discharge Instructions (Addendum)
Beginning the day after surgery:  You may sit in a tub of warm water 2-3 times a day to relieve discomfort.  Eat a regular diet high in fiber.  Avoid foods that give you constipation or diarrhea.  Avoid foods that are difficult to digest, such as seeds, nuts, corn or popcorn.  Do not go any longer than 2 days without a bowel movement.  You may take a dose of Milk of Magnesia if you become constipated.    Drink 6-8 glasses of water daily.  Walking is encouraged.  Avoid strenuous activity and heavy lifting for one month after surgery.    Resume Pradaxa in 24h  You may take Tylenol for pain.   Call the office if you have any questions or concerns.  Call immediately if you develop:   Excessive rectal bleeding (more than a cup or passing large clots)  Increased discomfort  Fever greater than 100 F  Difficulty urinating

## 2013-08-06 ENCOUNTER — Encounter (HOSPITAL_COMMUNITY): Payer: Self-pay | Admitting: General Surgery

## 2013-08-06 NOTE — Anesthesia Postprocedure Evaluation (Signed)
Anesthesia Post Note  Patient: Antonio [name]  Procedure(s) Performed: Procedure(s) (LRB): rigid proctoscopy, flexible sigmoidoscopy, proctectomy by tem  (N/A)  Anesthesia type: General  Patient location: PACU  Post pain: Pain level controlled  Post assessment: Post-op Vital signs reviewed  Last Vitals: BP 121/60  Pulse 78  Temp(Src) 36.3 C (Oral)  Resp 18  SpO2 95%  Post vital signs: Reviewed  Level of consciousness: sedated  Complications: No apparent anesthesia complications

## 2013-08-09 ENCOUNTER — Other Ambulatory Visit: Payer: Self-pay

## 2013-08-09 ENCOUNTER — Encounter (INDEPENDENT_AMBULATORY_CARE_PROVIDER_SITE_OTHER): Payer: Self-pay

## 2013-08-09 ENCOUNTER — Telehealth (INDEPENDENT_AMBULATORY_CARE_PROVIDER_SITE_OTHER): Payer: Self-pay

## 2013-08-09 MED ORDER — AMLODIPINE BESYLATE 10 MG PO TABS: 10.0000 mg | ORAL_TABLET | Freq: Every morning | ORAL | Status: DC

## 2013-08-09 NOTE — Telephone Encounter (Signed)
Wife states patient is constipated , advised to try miralax as directed, increase activity as tolerated,increase fluids,green leafy vegs, try 8 oz warmed prune juice or apple juice. To call  or  go to the ED if condition becomes worse

## 2013-08-27 ENCOUNTER — Ambulatory Visit (INDEPENDENT_AMBULATORY_CARE_PROVIDER_SITE_OTHER): Payer: Medicare Other | Admitting: General Surgery

## 2013-08-27 ENCOUNTER — Encounter (INDEPENDENT_AMBULATORY_CARE_PROVIDER_SITE_OTHER): Payer: Self-pay | Admitting: General Surgery

## 2013-08-27 VITALS — BP 132/84 | HR 78 | Temp 98.0°F | Resp 18 | Ht 71.0 in | Wt 210.0 lb

## 2013-08-27 DIAGNOSIS — Z9889 Other specified postprocedural states: Secondary | ICD-10-CM

## 2013-08-27 NOTE — Progress Notes (Signed)
Antonio [name]. is a 73 y.o. male who is status post a TEM resection of a mid rectal polyp on 3/23.  He is doing well. He has had minimal pain. He does have some itching that is getting better. He is having regular bowel movements. He denies any rectal bleeding.  Objective: Filed Vitals:   08/27/13 1007  BP: 132/84  Pulse: 78  Temp: 98 F (36.7 C)  Resp: 18   General appearance: alert and cooperative GI: normal findings: soft, non-tender  Assessment: s/p  Patient Active Problem List   Diagnosis Date Noted  . 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 Hypertension   . Hyperlipidemia   . PAF (paroxysmal atrial fibrillation)   . Chronic anticoagulation    Pathology Results Rectum, polyp(s) - TUBULOVILLOUS ADENOMA. NO HIGH GRADE DYSPLASIA OR MALIGNANCY IDENTIFIED.  Plan: Did well with TEM resection.  Polyp was on a stalk and easily resected. 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 Repeat colonoscopy per Dr. Oletta Lamas recommendations.    Rosario Adie, [facility] [place]   08/27/2013 10:11 AM

## 2013-08-27 NOTE — Patient Instructions (Signed)
Return to the office as needed.  Repeat colonoscopy per Dr Oletta Lamas

## 2013-10-01 ENCOUNTER — Other Ambulatory Visit: Payer: Self-pay | Admitting: Cardiology

## 2013-12-08 ENCOUNTER — Other Ambulatory Visit: Payer: Self-pay | Admitting: Cardiology

## 2013-12-09 NOTE — Telephone Encounter (Signed)
Antonio M Martinique, MD at 07/08/2013  9:36 AM dabigatran (PRADAXA) 150 MG CAPS capsule  Take 1 capsule (150 mg total) by mouth every 12 (twelve) hours.   Assessment / Plan: 1.Paroxysmal Atrial fibrillation. No significant symptoms. Continue Pradaxa.

## 2013-12-31 ENCOUNTER — Other Ambulatory Visit: Payer: Self-pay | Admitting: Cardiology

## 2014-01-06 ENCOUNTER — Ambulatory Visit: Payer: Medicare Other | Admitting: Cardiology

## 2014-02-12 ENCOUNTER — Other Ambulatory Visit: Payer: Self-pay

## 2014-02-12 MED ORDER — LISINOPRIL 20 MG PO TABS: ORAL_TABLET | ORAL | Status: DC

## 2014-02-21 ENCOUNTER — Encounter: Payer: Self-pay | Admitting: Cardiology

## 2014-02-21 ENCOUNTER — Ambulatory Visit (INDEPENDENT_AMBULATORY_CARE_PROVIDER_SITE_OTHER): Payer: Medicare Other | Admitting: Cardiology

## 2014-02-21 VITALS — BP 142/70 | HR 72 | Ht 69.0 in | Wt 208.1 lb

## 2014-02-21 DIAGNOSIS — I480 Paroxysmal atrial fibrillation: Secondary | ICD-10-CM

## 2014-02-21 DIAGNOSIS — Z7901 Long term (current) use of anticoagulants: Secondary | ICD-10-CM

## 2014-02-21 DIAGNOSIS — I10 Essential (primary) hypertension: Secondary | ICD-10-CM

## 2014-02-21 MED ORDER — HYDROCHLOROTHIAZIDE 12.5 MG PO CAPS: 12.5000 mg | ORAL_CAPSULE | Freq: Every morning | ORAL | Status: DC

## 2014-02-21 MED ORDER — AMLODIPINE BESYLATE 5 MG PO TABS
5.0000 mg | ORAL_TABLET | Freq: Every day | ORAL | Status: DC
Start: 2014-02-21 — End: 2014-09-19

## 2014-02-21 MED ORDER — LISINOPRIL 20 MG PO TABS: ORAL_TABLET | ORAL | Status: DC

## 2014-02-21 MED ORDER — FA-PYRIDOXINE-CYANOCOBALAMIN 2.5-25-2 MG PO TABS: 1.0000 | ORAL_TABLET | Freq: Every day | ORAL | Status: DC

## 2014-02-21 MED ORDER — SIMVASTATIN 20 MG PO TABS: 20.0000 mg | ORAL_TABLET | Freq: Every evening | ORAL | Status: DC

## 2014-02-21 MED ORDER — PANTOPRAZOLE SODIUM 40 MG PO TBEC: 40.0000 mg | DELAYED_RELEASE_TABLET | Freq: Every day | ORAL | Status: DC

## 2014-02-21 NOTE — Patient Instructions (Addendum)
Continue your current therapy  I will get a copy of your lab work from Dr. Redmond Pulling and refill your medications.  I will see you in 6 months.

## 2014-02-21 NOTE — Progress Notes (Signed)
Antonio [name]. Date of Birth: [birth year]   History of Present Illness: Antonio [name] is seen for followup today. He has a history of paroxysmal atrial fibrillation managed with rate control and anticoagulation. He states he is feeling well.  He denies any chest pain, palpitations, or shortness of breath. He had successful surgery in March for a large colon polyp that was benign. He does complain of weakness in his right leg related to prior CVA. He wears a brace.   Current Outpatient Prescriptions on File Prior to Visit  Medication Sig Dispense Refill  . acetaminophen (TYLENOL) 325 MG tablet Take 650 mg by mouth every 6 (six) hours as needed for mild pain or moderate pain.       . Ascorbic Acid (VITAMIN C) 500 MG tablet Take 1 tablet (500 mg total) by mouth daily.  30 tablet    . Coenzyme Q10 (COQ10) 200 MG CAPS Take 1 capsule by mouth daily.       . dabigatran (PRADAXA) 150 MG CAPS capsule Take 150 mg by mouth 2 (two) times daily.      . folic acid-pyridoxine-cyancobalamin (FOLTX) 2.5-25-2 MG TABS Take 1 tablet by mouth daily.      Marland Kitchen HYDROcodone-acetaminophen (LORTAB 5) 5-500 MG per tablet Take 1 tablet by mouth every 6 (six) hours as needed for pain.       No current facility-administered medications on file prior to visit.    No Known Allergies  Past Medical History  Diagnosis Date  . Hypertension   . Chronic anticoagulation     Replaced with Pradaxa  . Hyperlipidemia   . PAF (paroxysmal atrial fibrillation)   . Spinal stenosis      L4-5  . Coronary artery disease   . Hypercholesterolemia   . Elevated serum creatinine     improved with gentle hydration  . Hypotension     Postoperative hypotension improved with holding medication  . Dyslipidemia   . Stroke 5-6 yrs ago     Status post left hemisphere stroke with right-sided weakness  . CRI (chronic renal insufficiency)     no nephrologist    Past Surgical History  Procedure Laterality Date  . Cataract extraction  Bilateral yrs ago  . Cholecystectomy  yrs ago  . Back surgery   5 yrs ago    lower  . Partial proctectomy by tem N/A 08/05/2013    Procedure: rigid proctoscopy, flexible sigmoidoscopy, proctectomy by transanal endoscopic microsurgery;  Surgeon: Leighton Ruff, MD;  Location: [facility];  Service: General;  Laterality: N/A;    History  Smoking status  . Former Smoker -- 1.00 packs/day for 8 years  . Types: Cigarettes  . Quit date: 05/16/1968  Smokeless tobacco  . Never Used    History  Alcohol Use No    Family History  Problem Relation Age of Onset  . Hypertension Mother   . Stroke Mother   . Heart disease Mother   . Heart disease Father   . Diabetes Father   . Cancer Father      history of prostate, bladder and colon cancer  . Diabetes Brother   . Diabetes Brother     Review of System: As noted in history of present illness.  All other systems were reviewed and are negative.  Physical Exam: BP 142/70  Pulse 72  Ht 5\' 9"  (1.753 m)  Wt 208 lb 1 oz (94.377 kg)  BMI 30.71 kg/m2 Patient is very pleasant and in no acute distress.  Skin is warm and [name]. Color is normal.  HEENT is unremarkable. Normocephalic/atraumatic. PERRL. Sclera are nonicteric. Neck is supple. No masses. No JVD. Lungs are clear. Cardiac exam shows an irregular rhythm today. Abdomen is soft. Extremities are with just trace edema. He does have a resting tremor in his hands. Gait and ROM are intact. No gross neurologic deficits noted. He has a brace on his right leg.   LABORATORY DATA:   Assessment / Plan: 1.Paroxysmal Atrial fibrillation. No significant symptoms. Continue Pradaxa. He reports Antonio [name] did lab work. Will get a copy to make sure he doesn't need dose adjustment with CKD.   2. HTN controlled.  3. Hyperlipidemia.   4. CKD stage 3.

## 2014-03-20 ENCOUNTER — Other Ambulatory Visit: Payer: Self-pay | Admitting: Cardiology

## 2014-03-20 MED ORDER — LISINOPRIL 20 MG PO TABS: ORAL_TABLET | ORAL | Status: DC

## 2014-06-30 ENCOUNTER — Other Ambulatory Visit: Payer: Self-pay | Admitting: Nurse Practitioner

## 2014-06-30 MED ORDER — DABIGATRAN ETEXILATE MESYLATE 150 MG PO CAPS: 150.0000 mg | ORAL_CAPSULE | Freq: Two times a day (BID) | ORAL | Status: DC

## 2014-09-01 ENCOUNTER — Encounter: Payer: Self-pay | Admitting: Cardiology

## 2014-09-01 ENCOUNTER — Ambulatory Visit (INDEPENDENT_AMBULATORY_CARE_PROVIDER_SITE_OTHER): Payer: Medicare Other | Admitting: Cardiology

## 2014-09-01 VITALS — BP 144/76 | HR 62 | Ht 66.0 in | Wt 209.0 lb

## 2014-09-01 DIAGNOSIS — I480 Paroxysmal atrial fibrillation: Secondary | ICD-10-CM | POA: Diagnosis not present

## 2014-09-01 DIAGNOSIS — Z7901 Long term (current) use of anticoagulants: Secondary | ICD-10-CM | POA: Diagnosis not present

## 2014-09-01 DIAGNOSIS — I10 Essential (primary) hypertension: Secondary | ICD-10-CM | POA: Diagnosis not present

## 2014-09-01 NOTE — Addendum Note (Signed)
Addended by: Golden Hurter D on: 09/01/2014 11:55 AM   Modules accepted: Orders

## 2014-09-01 NOTE — Progress Notes (Signed)
Antonio Antonio [name]. Date of Birth: 13-Jan-1941   History of Present Illness: Antonio Antonio [name] is seen for followup Afib. Marland Kitchen He has a history of paroxysmal atrial fibrillation managed with rate control and anticoagulation. He is on Pradaxa. He states he is feeling well.  He denies any chest pain, palpitations, or shortness of breath. 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   Current Outpatient Prescriptions on File Prior to Visit  Medication Sig Dispense Refill  . acetaminophen (TYLENOL) 325 MG tablet Take 650 mg by mouth every 6 (six) hours as needed for mild pain or moderate pain.     Marland Kitchen amLODipine (NORVASC) 5 MG tablet Take 1 tablet (5 mg total) by mouth daily. 30 tablet 6  . Ascorbic Acid (VITAMIN C) 500 MG tablet Take 1 tablet (500 mg total) by mouth daily. 30 tablet   . Coenzyme Q10 (COQ10) 200 MG CAPS Take 1 capsule by mouth daily.     . dabigatran (PRADAXA) 150 MG CAPS capsule Take 1 capsule (150 mg total) by mouth 2 (two) times daily. 60 capsule 3  . folic acid-pyridoxine-cyancobalamin (FOLTX) 2.5-25-2 MG TABS Take 1 tablet by mouth daily.    . hydrochlorothiazide (MICROZIDE) 12.5 MG capsule Take 1 capsule (12.5 mg total) by mouth every morning. 30 capsule 6  . HYDROcodone-acetaminophen (LORTAB 5) 5-500 MG per tablet Take 1 tablet by mouth every 6 (six) hours as needed for pain.    Marland Kitchen lisinopril (PRINIVIL,ZESTRIL) 20 MG tablet TAKE 1 TABLET BY MOUTH EVERY DAY 30 tablet 6  . pantoprazole (PROTONIX) 40 MG tablet Take 1 tablet (40 mg total) by mouth daily. 30 tablet 6  . simvastatin (ZOCOR) 20 MG tablet Take 1 tablet (20 mg total) by mouth every evening. 30 tablet 6   No current facility-administered medications on file prior to visit.    No Known Allergies  Past Medical History  Diagnosis Date  . Hypertension   . Chronic anticoagulation     Replaced with Pradaxa  . Hyperlipidemia   . PAF (paroxysmal atrial fibrillation)   . Spinal stenosis      L4-5  . Coronary  artery disease   . Hypercholesterolemia   . Elevated serum creatinine     improved with gentle hydration  . Hypotension     Postoperative hypotension improved with holding medication  . Dyslipidemia   . Stroke 5-6 yrs ago     Status post left hemisphere stroke with right-sided weakness  . CRI (chronic renal insufficiency)     no nephrologist    Past Surgical History  Procedure Laterality Date  . Cataract extraction Bilateral yrs ago  . Cholecystectomy  yrs ago  . Back surgery   5 yrs ago    lower  . Partial proctectomy by tem N/A 08/05/2013    Procedure: rigid proctoscopy, flexible sigmoidoscopy, proctectomy by transanal endoscopic microsurgery;  Surgeon: Leighton Ruff, MD;  Location: [facility];  Service: General;  Laterality: N/A;    History  Smoking status  . Former Smoker -- 1.00 packs/day for 8 years  . Types: Cigarettes  . Quit date: 05/16/1968  Smokeless tobacco  . Never Used    History  Alcohol Use No    Family History  Problem Relation Age of Onset  . Hypertension Mother   . Stroke Mother   . Heart disease Mother   . Heart disease Father   . Diabetes Father   . Cancer Father  history of prostate, bladder and colon cancer  . Diabetes Brother   . Diabetes Brother     Review of System: As noted in history of present illness.  All other systems were reviewed and are negative.  Physical Exam: BP 144/76 mmHg  Pulse 62  Ht 5\' 6"  (1.676 m)  Wt 209 lb (94.802 kg)  BMI 33.75 kg/m2 Patient is very pleasant and in no acute distress. Skin is warm and Antonio [name]. Color is normal.  HEENT is unremarkable. Normocephalic/atraumatic. PERRL. Sclera are nonicteric. Neck is supple. No masses. No JVD. Lungs are clear. Cardiac exam shows an irregular rhythm today. Abdomen is soft. Extremities are with just trace edema. He does have a resting tremor in his hands. Gait and ROM are intact. No gross neurologic deficits noted. Chronic tremor LUE.  He has a brace on his right leg.    LABORATORY DATA: Ecg shows NSR with occ. PVC. Low voltage. Otherwise normal. I have personally reviewed and interpreted this study.   Assessment / Plan: 1.Paroxysmal Atrial fibrillation. No significant symptoms. Continue Pradaxa. He reports Dr. Redmond Pulling did lab work. Will get a copy.  2. HTN controlled.  3. Hyperlipidemia.   4. CKD stage 3.

## 2014-09-01 NOTE — Patient Instructions (Signed)
Continue your current therapy  I will see you in 6 months. We will request a copy of your lab work from Dr. Redmond Pulling.

## 2014-09-19 ENCOUNTER — Other Ambulatory Visit: Payer: Self-pay | Admitting: Cardiology

## 2014-09-24 ENCOUNTER — Other Ambulatory Visit: Payer: Self-pay

## 2014-09-30 ENCOUNTER — Other Ambulatory Visit: Payer: Self-pay | Admitting: *Deleted

## 2014-09-30 ENCOUNTER — Other Ambulatory Visit: Payer: Self-pay | Admitting: Cardiology

## 2014-09-30 MED ORDER — HYDROCHLOROTHIAZIDE 12.5 MG PO CAPS: 12.5000 mg | ORAL_CAPSULE | Freq: Every morning | ORAL | Status: DC

## 2014-10-01 NOTE — Telephone Encounter (Signed)
I don't see what medication this note pertains to. Not enough information  Peter Martinique MD, [facility]

## 2014-10-21 ENCOUNTER — Other Ambulatory Visit: Payer: Self-pay | Admitting: Nurse Practitioner

## 2014-10-30 ENCOUNTER — Other Ambulatory Visit: Payer: Self-pay | Admitting: *Deleted

## 2014-10-30 MED ORDER — LISINOPRIL 20 MG PO TABS: ORAL_TABLET | ORAL | Status: DC

## 2014-11-28 ENCOUNTER — Other Ambulatory Visit: Payer: Self-pay | Admitting: *Deleted

## 2014-11-28 MED ORDER — SIMVASTATIN 20 MG PO TABS: 20.0000 mg | ORAL_TABLET | Freq: Every evening | ORAL | Status: DC

## 2014-12-18 ENCOUNTER — Other Ambulatory Visit: Payer: Self-pay | Admitting: *Deleted

## 2014-12-18 MED ORDER — PANTOPRAZOLE SODIUM 40 MG PO TBEC: 40.0000 mg | DELAYED_RELEASE_TABLET | Freq: Every day | ORAL | Status: DC

## 2015-03-24 ENCOUNTER — Other Ambulatory Visit: Payer: Self-pay

## 2015-03-24 MED ORDER — SIMVASTATIN 20 MG PO TABS: 20.0000 mg | ORAL_TABLET | Freq: Every evening | ORAL | Status: DC

## 2015-03-27 ENCOUNTER — Ambulatory Visit (INDEPENDENT_AMBULATORY_CARE_PROVIDER_SITE_OTHER): Payer: Medicare Other | Admitting: Cardiology

## 2015-03-27 ENCOUNTER — Encounter: Payer: Self-pay | Admitting: Cardiology

## 2015-03-27 VITALS — BP 140/68 | HR 64 | Ht 69.0 in | Wt 202.3 lb

## 2015-03-27 DIAGNOSIS — E785 Hyperlipidemia, unspecified: Secondary | ICD-10-CM | POA: Diagnosis not present

## 2015-03-27 DIAGNOSIS — I10 Essential (primary) hypertension: Secondary | ICD-10-CM | POA: Diagnosis not present

## 2015-03-27 DIAGNOSIS — I480 Paroxysmal atrial fibrillation: Secondary | ICD-10-CM

## 2015-03-27 DIAGNOSIS — Z7901 Long term (current) use of anticoagulants: Secondary | ICD-10-CM | POA: Diagnosis not present

## 2015-03-27 MED ORDER — DABIGATRAN ETEXILATE MESYLATE 75 MG PO CAPS: 75.0000 mg | ORAL_CAPSULE | Freq: Two times a day (BID) | ORAL | Status: DC

## 2015-03-27 NOTE — Progress Notes (Signed)
Antonio Antonio [name]. Date of Birth: 01-11-1941   History of Present Illness: [name] is seen for followup Afib. Marland Kitchen He has a history of paroxysmal atrial fibrillation managed with rate control and anticoagulation. He is on Pradaxa. He states he is feeling well.  He denies any chest pain, palpitations, or shortness of breath. He remains active. Does note easy bruisability.  Current Outpatient Prescriptions on File Prior to Visit  Medication Sig Dispense Refill  . acetaminophen (TYLENOL) 325 MG tablet Take 650 mg by mouth every 6 (six) hours as needed for mild pain or moderate pain.     Marland Kitchen amLODipine (NORVASC) 5 MG tablet TAKE 1 TABLET (5 MG TOTAL) BY MOUTH DAILY. 30 tablet 6  . Ascorbic Acid (VITAMIN C) 500 MG tablet Take 1 tablet (500 mg total) by mouth daily. 30 tablet   . Coenzyme Q10 (COQ10) 200 MG CAPS Take 1 capsule by mouth daily.     . FOLBIC 2.5-25-2 MG TABS TAKE 1 TABLET BY MOUTH DAILY. 30 tablet 6  . folic acid-pyridoxine-cyancobalamin (FOLTX) 2.5-25-2 MG TABS Take 1 tablet by mouth daily.    . hydrochlorothiazide (MICROZIDE) 12.5 MG capsule Take 1 capsule (12.5 mg total) by mouth every morning. 30 capsule 6  . HYDROcodone-acetaminophen (LORTAB 5) 5-500 MG per tablet Take 1 tablet by mouth every 6 (six) hours as needed for pain.    Marland Kitchen lisinopril (PRINIVIL,ZESTRIL) 20 MG tablet TAKE 1 TABLET BY MOUTH EVERY DAY 30 tablet 5  . pantoprazole (PROTONIX) 40 MG tablet Take 1 tablet (40 mg total) by mouth daily. 30 tablet 10  . simvastatin (ZOCOR) 20 MG tablet Take 1 tablet (20 mg total) by mouth every evening. 30 tablet 3   No current facility-administered medications on file prior to visit.    No Known Allergies  Past Medical History  Diagnosis Date  . Hypertension   . Chronic anticoagulation     Replaced with Pradaxa  . Hyperlipidemia   . PAF (paroxysmal atrial fibrillation) ([place])   . Spinal stenosis      L4-5  . Coronary artery disease   . Hypercholesterolemia   . Elevated  serum creatinine     improved with gentle hydration  . Hypotension     Postoperative hypotension improved with holding medication  . Dyslipidemia   . Stroke [facility]) 5-6 yrs ago     Status post left hemisphere stroke with right-sided weakness  . CKD (chronic kidney disease), stage IV ([place])     no nephrologist    Past Surgical History  Procedure Laterality Date  . Cataract extraction Bilateral yrs ago  . Cholecystectomy  yrs ago  . Back surgery   5 yrs ago    lower  . Partial proctectomy by tem N/A 08/05/2013    Procedure: rigid proctoscopy, flexible sigmoidoscopy, proctectomy by transanal endoscopic microsurgery;  Surgeon: Leighton Ruff, MD;  Location: [facility];  Service: General;  Laterality: N/A;    History  Smoking status  . Former Smoker -- 1.00 packs/day for 8 years  . Types: Cigarettes  . Quit date: 05/16/1968  Smokeless tobacco  . Never Used    History  Alcohol Use No    Family History  Problem Relation Age of Onset  . Hypertension Mother   . Stroke Mother   . Heart disease Mother   . Heart disease Father   . Diabetes Father   . Cancer Father      history of prostate, bladder and colon cancer  . Diabetes  Brother   . Diabetes Brother     Review of System: As noted in history of present illness.  All other systems were reviewed and are negative.  Physical Exam: BP 140/68 mmHg  Pulse 64  Ht 5\' 9"  (1.753 m)  Wt 91.768 kg (202 lb 5 oz)  BMI 29.86 kg/m2 Patient is very pleasant and in no acute distress. Skin is warm and Antonio [name]. Color is normal.  HEENT is unremarkable. Normocephalic/atraumatic. PERRL. Sclera are nonicteric. Neck is supple. No masses. No JVD. Lungs are clear. Cardiac exam shows an irregular rhythm today. Abdomen is soft. Extremities are with just trace edema. He does have a resting tremor in his hands. Gait and ROM are intact. No gross neurologic deficits noted. Chronic tremor LUE.  He has a brace on his right leg.   LABORATORY DATA: Labs reviewed  from primary care. In Dec 2015 creatinine was 2.2 with GFR of 29. Labs done 02/08/15 showed creatinine of 1.8. Other chemistries were normal. Cholesterol 112. Trig-47, HDL 41, LDL 62.   Assessment / Plan: 1.Paroxysmal Atrial fibrillation. No significant symptoms. Continue Pradaxa. He has CKD stage 3-4. Given his lab results and age I have recommended reducing Pradaxa dose to 75 mg bid.   2. HTN controlled.  3. Hyperlipidemia.   4. CKD stage 3-4.   I will follow up in one year

## 2015-03-27 NOTE — Patient Instructions (Signed)
Reduce Pradaxa to 75 mg twice a day  I will get a copy of your lab work from Dr. Redmond Pulling.  Continue your other medication.

## 2015-03-30 ENCOUNTER — Encounter: Payer: Self-pay | Admitting: Cardiology

## 2015-04-09 ENCOUNTER — Other Ambulatory Visit: Payer: Self-pay | Admitting: Cardiology

## 2015-04-20 ENCOUNTER — Other Ambulatory Visit: Payer: Self-pay | Admitting: Cardiology

## 2015-04-20 NOTE — Telephone Encounter (Signed)
REFILL 

## 2015-04-27 ENCOUNTER — Other Ambulatory Visit: Payer: Self-pay | Admitting: Cardiology

## 2015-09-05 ENCOUNTER — Other Ambulatory Visit: Payer: Self-pay | Admitting: Cardiology

## 2015-09-07 NOTE — Telephone Encounter (Signed)
REFILL 

## 2015-10-05 ENCOUNTER — Other Ambulatory Visit: Payer: Self-pay

## 2015-10-05 MED ORDER — SIMVASTATIN 20 MG PO TABS: 20.0000 mg | ORAL_TABLET | Freq: Every day | ORAL | Status: DC

## 2015-11-02 ENCOUNTER — Other Ambulatory Visit: Payer: Self-pay | Admitting: Cardiology

## 2016-02-25 ENCOUNTER — Other Ambulatory Visit: Payer: Self-pay | Admitting: Cardiology

## 2016-03-08 ENCOUNTER — Other Ambulatory Visit: Payer: Self-pay | Admitting: Cardiology

## 2016-03-08 NOTE — Telephone Encounter (Signed)
Rx(s) sent to pharmacy electronically.  

## 2016-03-17 ENCOUNTER — Encounter: Payer: Self-pay | Admitting: Cardiology

## 2016-03-30 NOTE — Progress Notes (Signed)
Antonio [name]. Date of Birth: Feb 01, 1941   History of Present Illness: [name] is seen for followup Afib.  He has a history of paroxysmal atrial fibrillation managed with rate control and anticoagulation. He is on Pradaxa. He states he is feeling well.  He denies any chest pain, palpitations, or shortness of breath. He remains active. Does note easy bruisability.  Current Outpatient Prescriptions on File Prior to Visit  Medication Sig Dispense Refill  . acetaminophen (TYLENOL) 325 MG tablet Take 650 mg by mouth every 6 (six) hours as needed for mild pain or moderate pain.     Marland Kitchen amLODipine (NORVASC) 5 MG tablet TAKE 1 TABLET BY MOUTH EVERY DAY 30 tablet 11  . Ascorbic Acid (VITAMIN C) 500 MG tablet Take 1 tablet (500 mg total) by mouth daily. 30 tablet   . Coenzyme Q10 (COQ10) 200 MG CAPS Take 1 capsule by mouth daily.     . dabigatran (PRADAXA) 75 MG CAPS capsule Take 1 capsule (75 mg total) by mouth 2 (two) times daily. 60 capsule 11  . hydrochlorothiazide (MICROZIDE) 12.5 MG capsule TAKE 1 CAPSULE (12.5 MG TOTAL) BY MOUTH EVERY MORNING. 30 capsule 11  . HYDROcodone-acetaminophen (LORTAB 5) 5-500 MG per tablet Take 1 tablet by mouth every 6 (six) hours as needed for pain.    Marland Kitchen lisinopril (PRINIVIL,ZESTRIL) 20 MG tablet TAKE 1 TABLET BY MOUTH EVERY DAY 30 tablet 11  . pantoprazole (PROTONIX) 40 MG tablet TAKE 1 TABLET BY MOUTH EVERY DAY 60 tablet 0  . simvastatin (ZOCOR) 20 MG tablet Take 1 tablet (20 mg total) by mouth daily at 6 PM. NEED OV. 30 tablet 6  . VIRT-VITE FORTE 2.5-25-2 MG TABS tablet TAKE 1 TABLET BY MOUTH EVERY DAY 30 tablet 6   No current facility-administered medications on file prior to visit.     No Known Allergies  Past Medical History:  Diagnosis Date  . Chronic anticoagulation    Replaced with Pradaxa  . CKD (chronic kidney disease), stage IV ([place])    no nephrologist  . Coronary artery disease   . Dyslipidemia   . Elevated serum creatinine     improved with gentle hydration  . Hypercholesterolemia   . Hyperlipidemia   . Hypertension   . Hypotension    Postoperative hypotension improved with holding medication  . PAF (paroxysmal atrial fibrillation) ([place])   . Spinal stenosis     L4-5  . Stroke [facility]) 5-6 yrs ago    Status post left hemisphere stroke with right-sided weakness    Past Surgical History:  Procedure Laterality Date  . BACK SURGERY   5 yrs ago   lower  . CATARACT EXTRACTION Bilateral yrs ago  . CHOLECYSTECTOMY  yrs ago  . PARTIAL PROCTECTOMY BY TEM N/A 08/05/2013   Procedure: rigid proctoscopy, flexible sigmoidoscopy, proctectomy by transanal endoscopic microsurgery;  Surgeon: Leighton Ruff, MD;  Location: [facility];  Service: General;  Laterality: N/A;    History  Smoking Status  . Former Smoker  . Packs/day: 1.00  . Years: 8.00  . Types: Cigarettes  . Quit date: 05/16/1968  Smokeless Tobacco  . Never Used    History  Alcohol Use No    Family History  Problem Relation Age of Onset  . Hypertension Mother   . Stroke Mother   . Heart disease Mother   . Heart disease Father   . Diabetes Father   . Cancer Father      history of prostate, bladder and  colon cancer  . Diabetes Brother   . Diabetes Brother     Review of System: As noted in history of present illness.  All other systems were reviewed and are negative.  Physical Exam: There were no vitals taken for this visit. Patient is very pleasant and in no acute distress. Skin is warm and [name]. Color is normal.  HEENT is unremarkable. Normocephalic/atraumatic. PERRL. Sclera are nonicteric. Neck is supple. No masses. No JVD. Lungs are clear. Cardiac exam shows an irregular rhythm today. Abdomen is soft. Extremities are with just trace edema. He does have a resting tremor in his hands. Gait and ROM are intact. No gross neurologic deficits noted. Chronic tremor LUE.  He has a brace on his right leg.   LABORATORY DATA: Labs reviewed from primary  care. 11/06/15: BUN 29, creatinine 1.76. Other chemistries normal. Cholesterol 108, triglycerides 47, HDL 42, LDL 72. CBC normal  Ecg today shows Afib with rate 80. ? Old septal infarct. I have personally reviewed and interpreted this study.  Assessment / Plan: 1.Paroxysmal Atrial fibrillation. No significant symptoms. Continue Pradaxa. He has CKD stage 3-4. Given his lab results and age I have recommended continuing Pradaxa  75 mg bid.   2. HTN controlled.  3. Hyperlipidemia.   4. CKD stage 3-4.   I will follow up in one year

## 2016-04-01 ENCOUNTER — Ambulatory Visit (INDEPENDENT_AMBULATORY_CARE_PROVIDER_SITE_OTHER): Payer: Medicare Other | Admitting: Cardiology

## 2016-04-01 ENCOUNTER — Encounter: Payer: Self-pay | Admitting: Cardiology

## 2016-04-01 VITALS — BP 157/75 | HR 80 | Ht 73.0 in | Wt 194.6 lb

## 2016-04-01 DIAGNOSIS — I480 Paroxysmal atrial fibrillation: Secondary | ICD-10-CM | POA: Diagnosis not present

## 2016-04-01 DIAGNOSIS — I10 Essential (primary) hypertension: Secondary | ICD-10-CM

## 2016-04-01 DIAGNOSIS — E7800 Pure hypercholesterolemia, unspecified: Secondary | ICD-10-CM | POA: Diagnosis not present

## 2016-04-01 NOTE — Patient Instructions (Signed)
Continue your current therapy  I will see you in one year   

## 2016-04-06 ENCOUNTER — Other Ambulatory Visit: Payer: Self-pay | Admitting: Cardiology

## 2016-04-06 NOTE — Telephone Encounter (Signed)
Rx request sent to pharmacy.  

## 2016-04-12 ENCOUNTER — Other Ambulatory Visit: Payer: Self-pay

## 2016-04-12 MED ORDER — HYDROCHLOROTHIAZIDE 12.5 MG PO CAPS: 12.5000 mg | ORAL_CAPSULE | Freq: Every day | ORAL | 9 refills | Status: DC

## 2016-04-22 ENCOUNTER — Other Ambulatory Visit: Payer: Self-pay

## 2016-04-22 MED ORDER — SIMVASTATIN 20 MG PO TABS: 20.0000 mg | ORAL_TABLET | Freq: Every day | ORAL | 6 refills | Status: DC

## 2016-04-29 ENCOUNTER — Other Ambulatory Visit: Payer: Self-pay | Admitting: *Deleted

## 2016-04-29 MED ORDER — PANTOPRAZOLE SODIUM 40 MG PO TBEC: 40.0000 mg | DELAYED_RELEASE_TABLET | Freq: Every day | ORAL | 3 refills | Status: DC

## 2016-10-16 ENCOUNTER — Other Ambulatory Visit: Payer: Self-pay | Admitting: Cardiology

## 2016-10-17 NOTE — Telephone Encounter (Signed)
Rx has been sent to the pharmacy electronically. ° °

## 2017-03-06 ENCOUNTER — Emergency Department (HOSPITAL_BASED_OUTPATIENT_CLINIC_OR_DEPARTMENT_OTHER)
Admission: EM | Admit: 2017-03-06 | Discharge: 2017-03-06 | Disposition: A | Discharge status: Home / Self Care | Payer: Medicare Other | Attending: Emergency Medicine | Admitting: Emergency Medicine

## 2017-03-06 ENCOUNTER — Emergency Department (HOSPITAL_BASED_OUTPATIENT_CLINIC_OR_DEPARTMENT_OTHER): Payer: Medicare Other

## 2017-03-06 ENCOUNTER — Encounter (HOSPITAL_BASED_OUTPATIENT_CLINIC_OR_DEPARTMENT_OTHER): Payer: Self-pay | Admitting: *Deleted

## 2017-03-06 DIAGNOSIS — I129 Hypertensive chronic kidney disease with stage 1 through stage 4 chronic kidney disease, or unspecified chronic kidney disease: Secondary | ICD-10-CM | POA: Diagnosis not present

## 2017-03-06 DIAGNOSIS — Z8673 Personal history of transient ischemic attack (TIA), and cerebral infarction without residual deficits: Secondary | ICD-10-CM | POA: Insufficient documentation

## 2017-03-06 DIAGNOSIS — Y939 Activity, unspecified: Secondary | ICD-10-CM | POA: Diagnosis not present

## 2017-03-06 DIAGNOSIS — Z79899 Other long term (current) drug therapy: Secondary | ICD-10-CM | POA: Diagnosis not present

## 2017-03-06 DIAGNOSIS — S92412A Displaced fracture of proximal phalanx of left great toe, initial encounter for closed fracture: Secondary | ICD-10-CM

## 2017-03-06 DIAGNOSIS — L03116 Cellulitis of left lower limb: Secondary | ICD-10-CM | POA: Insufficient documentation

## 2017-03-06 DIAGNOSIS — S99922A Unspecified injury of left foot, initial encounter: Secondary | ICD-10-CM | POA: Diagnosis present

## 2017-03-06 DIAGNOSIS — Z87891 Personal history of nicotine dependence: Secondary | ICD-10-CM | POA: Diagnosis not present

## 2017-03-06 DIAGNOSIS — Y929 Unspecified place or not applicable: Secondary | ICD-10-CM | POA: Diagnosis not present

## 2017-03-06 DIAGNOSIS — Z7901 Long term (current) use of anticoagulants: Secondary | ICD-10-CM | POA: Diagnosis not present

## 2017-03-06 DIAGNOSIS — N184 Chronic kidney disease, stage 4 (severe): Secondary | ICD-10-CM | POA: Diagnosis not present

## 2017-03-06 DIAGNOSIS — Y999 Unspecified external cause status: Secondary | ICD-10-CM | POA: Insufficient documentation

## 2017-03-06 DIAGNOSIS — W230XXA Caught, crushed, jammed, or pinched between moving objects, initial encounter: Secondary | ICD-10-CM | POA: Diagnosis not present

## 2017-03-06 MED ORDER — DOXYCYCLINE HYCLATE 100 MG PO CAPS: 100.0000 mg | ORAL_CAPSULE | Freq: Two times a day (BID) | ORAL | 0 refills | Status: DC

## 2017-03-06 MED FILL — DOXYCYCLINE HYCLATE 100 MG: 100 | 7 days supply | Qty: 14 | Fill #0

## 2017-03-06 NOTE — ED Triage Notes (Signed)
Pt reports dropping a box of coins "very heavy" onto his left foot 3 weeks ago, per pt son it initially was swollen, bruised but pt was able to bear weight. Son states swelling never really resolved, but bruising went away, seen by his pcp when this happened with no xrays taken per son. Son states over the last few days the swelling increased and redness increased. Blister noted to left great toe near nail area.

## 2017-03-06 NOTE — ED Triage Notes (Signed)
Left foot and great toe are swollen, red, and warm to touch.

## 2017-03-06 NOTE — ED Provider Notes (Signed)
[place] EMERGENCY DEPARTMENT Provider Note   CSN: [id number] Arrival date & time: 03/06/17  1054     History   Chief Complaint Chief Complaint  Patient presents with  . Foot Pain    HPI Antonio Sessler. is a 76 y.o. male.  The history is provided by the patient and a relative. No language interpreter was used.   Antonio Teegarden. is a 76 y.o. male who presents to the Emergency Department complaining of foot pain.  He reports three weeks of left foot pain after dropping a 25 pound box of coins on his right great toe.  He saw his PCP and was started on antibiotics for possible cellulitis.  The redness in his great toe transiently improved only to worsen over the last few days.  He is able to bear weight but the pain is worse with standing and walking.  Sxs are mild to moderate in nature.  He takes pradaxa for hx/o CVA, afib.   Past Medical History:  Diagnosis Date  . Chronic anticoagulation    Replaced with Pradaxa  . CKD (chronic kidney disease), stage IV ([place])    no nephrologist  . Coronary artery disease   . Dyslipidemia   . Elevated serum creatinine    improved with gentle hydration  . Hypercholesterolemia   . Hyperlipidemia   . Hypertension   . Hypotension    Postoperative hypotension improved with holding medication  . PAF (paroxysmal atrial fibrillation) ([place])   . Spinal stenosis     L4-5  . Stroke [facility]) 5-6 yrs ago    Status post left hemisphere stroke with right-sided weakness    Patient Active Problem List   Diagnosis Date Noted  . 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 Hypertension   . Hyperlipidemia   . PAF (paroxysmal atrial fibrillation) ([place])   . Chronic anticoagulation     Past Surgical History:  Procedure Laterality Date  . BACK SURGERY   5 yrs ago   lower  . CATARACT EXTRACTION Bilateral yrs ago  . CHOLECYSTECTOMY  yrs ago  . PARTIAL PROCTECTOMY BY TEM N/A 08/05/2013   Procedure: rigid proctoscopy, flexible sigmoidoscopy,  proctectomy by transanal endoscopic microsurgery;  Surgeon: Leighton Ruff, MD;  Location: [facility];  Service: General;  Laterality: N/A;       Home Medications    Prior to Admission medications   Medication Sig Start Date End Date Taking? Authorizing Provider  acetaminophen (TYLENOL) 325 MG tablet Take 650 mg by mouth every 6 (six) hours as needed for mild pain or moderate pain.     [provider]  amLODipine (NORVASC) 5 MG tablet TAKE 1 TABLET BY MOUTH EVERY DAY 04/13/15   [place]ique, Peter M, MD  Ascorbic Acid (VITAMIN C) 500 MG tablet Take 1 tablet (500 mg total) by mouth daily. 08/19/10   [place], Peter M, MD  Coenzyme Q10 (COQ10) 200 MG CAPS Take 1 capsule by mouth daily.  08/19/10   [place], Peter M, MD  dabigatran (PRADAXA) 75 MG CAPS capsule Take 1 capsule (75 mg total) by mouth 2 (two) times daily. 03/27/15   [place], Peter M, MD  doxycycline (VIBRAMYCIN) 100 MG capsule Take 1 capsule (100 mg total) by mouth 2 (two) times daily. 03/06/17   Quintella Reichert, MD  hydrochlorothiazide (MICROZIDE) 12.5 MG capsule Take 1 capsule (12.5 mg total) by mouth daily. 04/12/16   [place], Peter M, MD  HYDROcodone-acetaminophen (LORTAB 5) 5-500 MG per tablet Take 1 tablet by mouth  every 6 (six) hours as needed for pain.    [provider]  lisinopril (PRINIVIL,ZESTRIL) 20 MG tablet TAKE 1 TABLET BY MOUTH EVERY DAY 04/06/16   [place], Peter M, MD  pantoprazole (PROTONIX) 40 MG tablet Take 1 tablet (40 mg total) by mouth daily. 04/29/16   [place], Peter M, MD  simvastatin (ZOCOR) 20 MG tablet Take 1 tablet (20 mg total) by mouth daily at 6 PM. NEED OV. 04/22/16   [place], Peter M, MD  VIRT-VITE FORTE 2.5-25-2 MG TABS tablet TAKE 1 TABLET BY MOUTH EVERY DAY 10/17/16   [place], Peter M, MD    Family History Family History  Problem Relation Age of Onset  . Hypertension Mother   . Stroke Mother   . Heart disease Mother   . Heart disease Father   . Diabetes Father   . Cancer Father         history of  prostate, bladder and colon cancer  . Diabetes Brother   . Diabetes Brother     Social History Social History  Substance Use Topics  . Smoking status: Former Smoker    Packs/day: 1.00    Years: 8.00    Types: Cigarettes    Quit date: 05/16/1968  . Smokeless tobacco: Never Used  . Alcohol use No     Allergies   Patient has no known allergies.   Review of Systems Review of Systems  All other systems reviewed and are negative.    Physical Exam Updated Vital Signs BP (!) 150/88 (BP Location: Right Arm)   Pulse 75   Temp 98.2 F (36.8 C) (Oral)   Resp 18   Ht 5\' 9"  (1.753 m)   Wt 83.9 kg (185 lb)   SpO2 98%   BMI 27.32 kg/m   Physical Exam  Constitutional: He is oriented to person, place, and time. He appears well-developed and well-nourished.  HENT:  Head: Normocephalic and atraumatic.  Cardiovascular: Normal rate.   Pulmonary/Chest: No respiratory distress.  Musculoskeletal:  2+ DP pulses bilaterally.  There is a deformity to the left great toe with a vesicle at the base of the nail bed.  There is erythema of the toe and distal foot.  There is no significant tenderness to palpation throughout the foot.  Neurological: He is alert and oriented to person, place, and time.  Skin: Skin is warm and dry. Capillary refill takes less than 2 seconds.  Psychiatric: He has a normal mood and affect. His behavior is normal.  Nursing note and vitals reviewed.    ED Treatments / Results  Labs (all labs ordered are listed, but only abnormal results are displayed) Labs Reviewed - No data to display  EKG  EKG Interpretation None       Radiology Dg Foot Complete Left  Result Date: 03/06/2017 CLINICAL DATA:  Trauma. EXAM: LEFT FOOT - COMPLETE 3+ VIEW COMPARISON:  None. FINDINGS: There is a comminuted, slightly displaced fracture involving the first proximal phalanx, with intra-articular extension into the first IP joint. Mild degenerative changes of the first MTP joint.  Osteopenia. Diffuse vascular calcifications. Large plantar enthesophyte. Mild soft tissue swelling of the great toe. IMPRESSION: Mildly comminuted and slightly displaced fracture of the first proximal phalanx, extending into the first IP joint. Electronically Signed   By: Titus Dubin M.D.   On: 03/06/2017 11:55    Procedures Procedures (including critical care time)  Medications Ordered in ED Medications - No data to display   Initial Impression / Assessment and  Plan / ED Course  I have reviewed the triage vital signs and the nursing notes.  Pertinent labs & imaging results that were available during my care of the patient were reviewed by me and considered in my medical decision making (see chart for details).     The patient here for evaluation of toe pain and redness after dropping a box of coins on it 3 weeks ago.  Imaging does demonstrate a fracture of the first phalanx.  Skin exam is concerning for possible developing cellulitis given patient's initial resolution of erythema followed by recurrent erythema.  Will start on antibiotics.  Given patient has been weightbearing on this injury for the last 3 weeks will place in cam walker for comfort with PCP follow-up.  Return precautions discussed.  Final Clinical Impressions(s) / ED Diagnoses   Final diagnoses:  Displaced fracture of proximal phalanx of left great toe, initial encounter for closed fracture  Cellulitis of left lower extremity    New Prescriptions Discharge Medication List as of 03/06/2017  1:46 PM    START taking these medications   Details  doxycycline (VIBRAMYCIN) 100 MG capsule Take 1 capsule (100 mg total) by mouth 2 (two) times daily., Starting Mon 03/06/2017, Print         Quintella Reichert, MD 03/07/17 [phone]

## 2017-03-22 ENCOUNTER — Other Ambulatory Visit: Payer: Self-pay | Admitting: Cardiology

## 2017-04-03 ENCOUNTER — Other Ambulatory Visit: Payer: Self-pay | Admitting: Cardiology

## 2017-04-21 ENCOUNTER — Other Ambulatory Visit: Payer: Self-pay | Admitting: Cardiology

## 2017-05-10 DIAGNOSIS — I639 Cerebral infarction, unspecified: Secondary | ICD-10-CM | POA: Insufficient documentation

## 2017-05-10 DIAGNOSIS — E7800 Pure hypercholesterolemia, unspecified: Secondary | ICD-10-CM | POA: Insufficient documentation

## 2017-05-10 DIAGNOSIS — E785 Hyperlipidemia, unspecified: Secondary | ICD-10-CM | POA: Insufficient documentation

## 2017-05-10 DIAGNOSIS — I2510 Atherosclerotic heart disease of native coronary artery without angina pectoris: Secondary | ICD-10-CM | POA: Insufficient documentation

## 2017-05-10 DIAGNOSIS — M4800 Spinal stenosis, site unspecified: Secondary | ICD-10-CM | POA: Insufficient documentation

## 2017-05-10 DIAGNOSIS — N184 Chronic kidney disease, stage 4 (severe): Secondary | ICD-10-CM | POA: Insufficient documentation

## 2017-05-10 DIAGNOSIS — R7989 Other specified abnormal findings of blood chemistry: Secondary | ICD-10-CM | POA: Insufficient documentation

## 2017-05-22 ENCOUNTER — Other Ambulatory Visit: Payer: Self-pay | Admitting: Family Medicine

## 2017-05-22 DIAGNOSIS — M48062 Spinal stenosis, lumbar region with neurogenic claudication: Secondary | ICD-10-CM

## 2017-05-25 ENCOUNTER — Other Ambulatory Visit: Payer: Self-pay | Admitting: Cardiology

## 2017-05-29 ENCOUNTER — Ambulatory Visit: Payer: Medicare Other | Admitting: Cardiology

## 2017-06-04 NOTE — Progress Notes (Signed)
Antonio [name]. Date of Birth: 11/17/1940   History of Present Illness: Antonio [name] is seen for followup Afib.  He has a history of paroxysmal atrial fibrillation managed with rate control and anticoagulation. He is on Pradaxa.  He was recently admitted to [facility] with acute sciatica. MRI of spine and sacrum done. Seen by Neurosurgery.  He was treated with conservative therapy with steroids, Neurontin, and methocarbamol. He is seen in a wheelchair today. Family notes his speech is more slurred/difficult to understand since he was in the hospital.  He denies any chest pain, palpitations, or shortness of breath. No bleeding problems on Pradaxa.   Current Outpatient Medications on File Prior to Visit  Medication Sig Dispense Refill  . acetaminophen (TYLENOL) 325 MG tablet Take 650 mg by mouth every 6 (six) hours as needed for mild pain or moderate pain.     Marland Kitchen amLODipine (NORVASC) 5 MG tablet TAKE 1 TABLET BY MOUTH EVERY DAY 30 tablet 11  . Ascorbic Acid (VITAMIN C) 500 MG tablet Take 1 tablet (500 mg total) by mouth daily. 30 tablet   . Coenzyme Q10 (COQ10) 200 MG CAPS Take 1 capsule by mouth daily.     . dabigatran (PRADAXA) 75 MG CAPS capsule Take 1 capsule (75 mg total) by mouth 2 (two) times daily. 60 capsule 11  . FOLBIC 2.5-25-2 MG TABS tablet TAKE 1 TABLET BY MOUTH EVERY DAY 30 tablet 0  . gabapentin (NEURONTIN) 300 MG capsule Take 300 mg by mouth 3 (three) times daily.    . hydrochlorothiazide (MICROZIDE) 12.5 MG capsule Take 1 capsule (12.5 mg total) by mouth daily. 30 capsule 9  . HYDROcodone-acetaminophen (LORTAB 5) 5-500 MG per tablet Take 1 tablet by mouth every 6 (six) hours as needed for pain.    Marland Kitchen lisinopril (PRINIVIL,ZESTRIL) 20 MG tablet TAKE 1 TABLET BY MOUTH EVERY DAY 30 tablet 6  . pantoprazole (PROTONIX) 40 MG tablet Take 1 tablet (40 mg total) by mouth daily. 60 tablet 3  . doxycycline (VIBRAMYCIN) 100 MG capsule Take 1 capsule (100 mg total) by mouth 2  (two) times daily. 14 capsule 0   No current facility-administered medications on file prior to visit.     No Known Allergies  Past Medical History:  Diagnosis Date  . Chronic anticoagulation    Replaced with Pradaxa  . CKD (chronic kidney disease), stage IV ([place])    no nephrologist  . Coronary artery disease   . Dyslipidemia   . Elevated serum creatinine    improved with gentle hydration  . Hypercholesterolemia   . Hyperlipidemia   . Hypertension   . Hypotension    Postoperative hypotension improved with holding medication  . PAF (paroxysmal atrial fibrillation) ([place])   . Spinal stenosis     L4-5  . Stroke [facility]) 5-6 yrs ago    Status post left hemisphere stroke with right-sided weakness    Past Surgical History:  Procedure Laterality Date  . BACK SURGERY   5 yrs ago   lower  . CATARACT EXTRACTION Bilateral yrs ago  . CHOLECYSTECTOMY  yrs ago  . PARTIAL PROCTECTOMY BY TEM N/A 08/05/2013   Procedure: rigid proctoscopy, flexible sigmoidoscopy, proctectomy by transanal endoscopic microsurgery;  Surgeon: Leighton Ruff, MD;  Location: [facility];  Service: General;  Laterality: N/A;    Social History   Tobacco Use  Smoking Status Former Smoker  . Packs/day: 1.00  . Years: 8.00  . Pack years: 8.00  . Types: Cigarettes  .  Last attempt to quit: 05/16/1968  . Years since quitting: 49.0  Smokeless Tobacco Never Used    Social History   Substance and Sexual Activity  Alcohol Use No    Family History  Problem Relation Age of Onset  . Hypertension Mother   . Stroke Mother   . Heart disease Mother   . Heart disease Father   . Diabetes Father   . Cancer Father         history of prostate, bladder and colon cancer  . Diabetes Brother   . Diabetes Brother     Review of System: As noted in history of present illness.  All other systems were reviewed and are negative.  Physical Exam: BP (!) 100/54   Pulse 68   Ht 5\' 9"  (1.753 m)   Wt 180 lb (81.6 kg)   BMI 26.58  kg/m  GENERAL:  Elderly WM seen in wheelchair.  HEENT:  PERRL, EOMI, sclera are clear. Oropharynx is clear. NECK:  No jugular venous distention, carotid upstroke brisk and symmetric, no bruits, no thyromegaly or adenopathy LUNGS:  Clear to auscultation bilaterally CHEST:  Unremarkable HEART:  IRRR,  PMI not displaced or sustained,S1 and S2 within normal limits, no S3, no S4: no clicks, no rubs, no murmurs ABD:  Soft, nontender. BS +, no masses or bruits. No hepatomegaly, no splenomegaly EXT:  2 + pulses throughout, no edema, no cyanosis no clubbing SKIN:  Warm and [name].  No rashes NEURO:  Alert and oriented x 3. Speech is somewhat slurred.  PSYCH:  Cognitively intact    LABORATORY DATA: Labs reviewed from primary care. 11/06/15: BUN 29, creatinine 1.76. Other chemistries normal. Cholesterol 108, triglycerides 47, HDL 42, LDL 72. CBC normal Dated 11/21/16: creatinine 1.85. BUN 34. Other chemistries are normal. CBC normal. Dated 05/26/17: BUN 58, creatinine 1.96. Glucose 131. Hgb 12.3.   Ecg today shows NSR with PACs rate 68. Otherwise normal.  I have personally reviewed and interpreted this study.  Assessment / Plan: 1.Paroxysmal Atrial fibrillation. Asymptomatic.  Continue Pradaxa. He has CKD stage 3-4. Given his CKD and age I have recommended continuing Pradaxa  75 mg bid.   2. HTN controlled.  3. Hyperlipidemia. On simvastatin. Given interaction with amlodipine I have recommended switching simvastatin to lipitor 10 mg daily.   4. CKD stage 3-4.   5. Acute sciatica. Follow up with Neurosurgery. If needed I think his risk for surgery would be low and he could interrupt Pradaxa for 72 hours prior to surgery. I think his more slurred speech may be more related to gabapentin and methocarbamol. Will monitor.   I will follow up in one year

## 2017-06-05 ENCOUNTER — Encounter: Payer: Self-pay | Admitting: Cardiology

## 2017-06-05 ENCOUNTER — Ambulatory Visit (INDEPENDENT_AMBULATORY_CARE_PROVIDER_SITE_OTHER): Payer: Medicare Other | Admitting: Cardiology

## 2017-06-05 VITALS — BP 100/54 | HR 68 | Ht 69.0 in | Wt 180.0 lb

## 2017-06-05 DIAGNOSIS — E7800 Pure hypercholesterolemia, unspecified: Secondary | ICD-10-CM

## 2017-06-05 DIAGNOSIS — I10 Essential (primary) hypertension: Secondary | ICD-10-CM

## 2017-06-05 DIAGNOSIS — I480 Paroxysmal atrial fibrillation: Secondary | ICD-10-CM

## 2017-06-05 MED ORDER — ATORVASTATIN CALCIUM 10 MG PO TABS: 10.0000 mg | ORAL_TABLET | Freq: Every day | ORAL | 3 refills | Status: DC

## 2017-06-05 NOTE — Patient Instructions (Signed)
We will switch simvastatin to lipitor (atorvastatin) 10 mg daily  Continue your other therapy  I will see you in one year.

## 2017-06-22 ENCOUNTER — Other Ambulatory Visit: Payer: Self-pay | Admitting: Cardiology

## 2017-06-22 NOTE — Telephone Encounter (Signed)
REFILL 

## 2017-09-08 ENCOUNTER — Other Ambulatory Visit: Payer: Self-pay

## 2017-09-08 MED ORDER — PANTOPRAZOLE SODIUM 40 MG PO TBEC: 40.0000 mg | DELAYED_RELEASE_TABLET | Freq: Every day | ORAL | 3 refills | Status: DC

## 2017-09-21 ENCOUNTER — Other Ambulatory Visit: Payer: Self-pay

## 2017-09-21 MED ORDER — PANTOPRAZOLE SODIUM 40 MG PO TBEC: 40.0000 mg | DELAYED_RELEASE_TABLET | Freq: Every day | ORAL | 6 refills | Status: DC

## 2017-12-07 IMAGING — CR DG FOOT COMPLETE 3+V*L*
3 series · 3 of 3 positions shown · non-contrast
Comparison: None.

CLINICAL DATA: Trauma.

EXAM:
LEFT FOOT - COMPLETE 3+ VIEW

[t foot ap left]
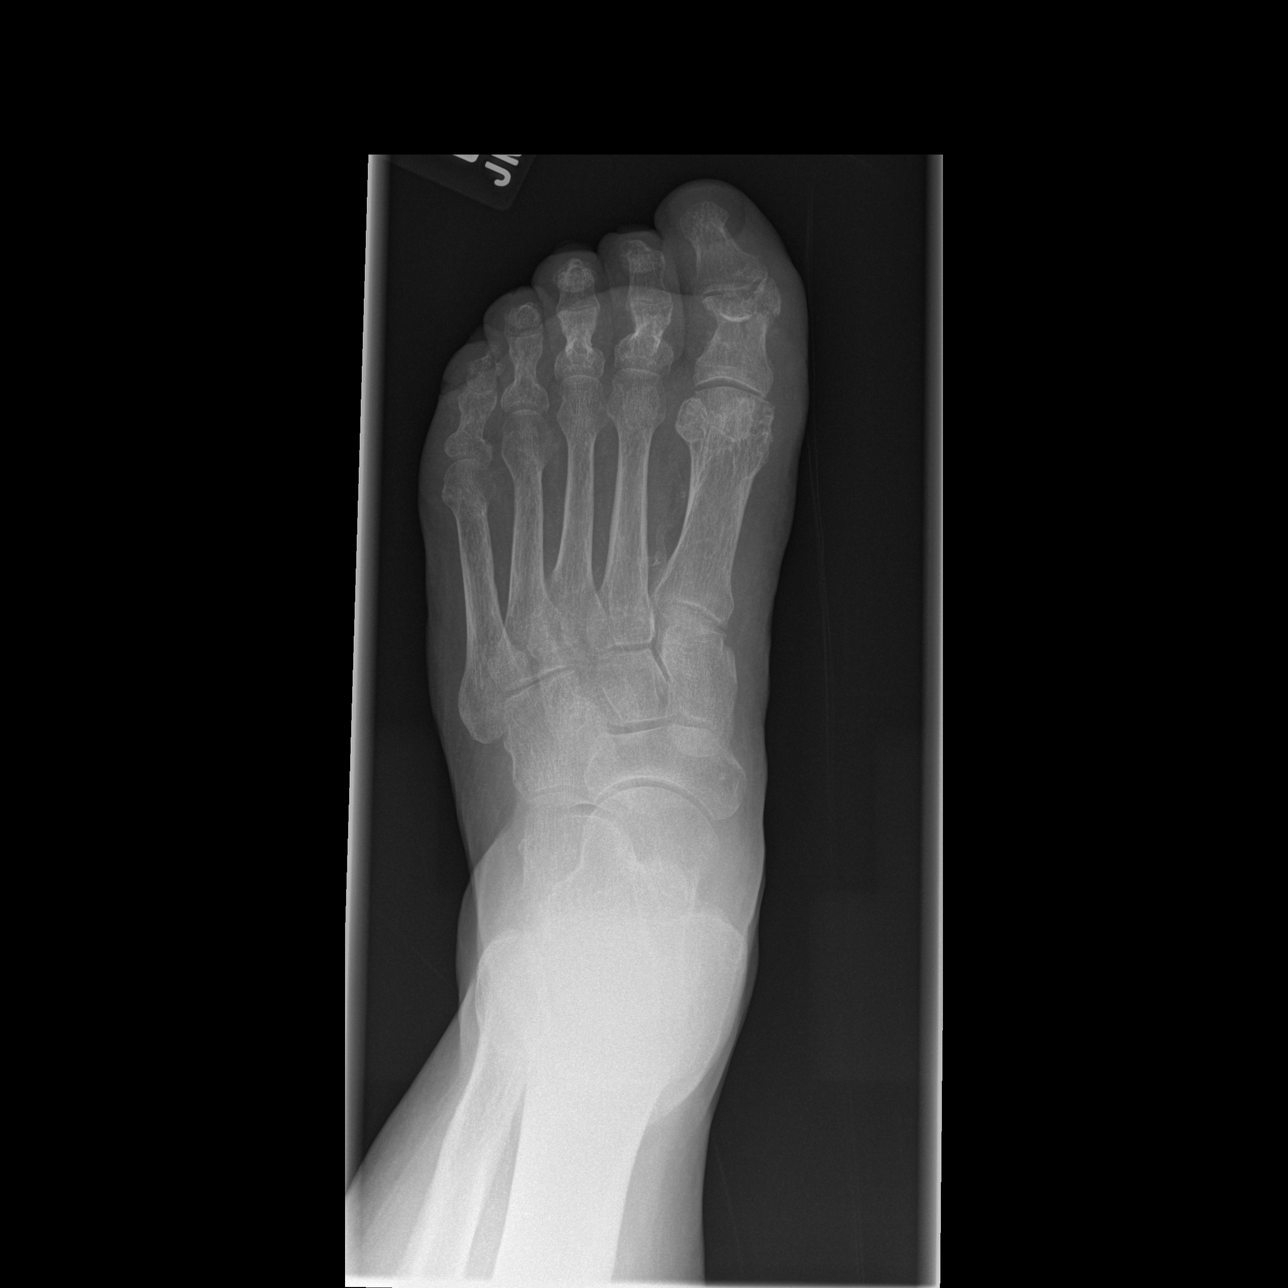

[t foot oblique left]
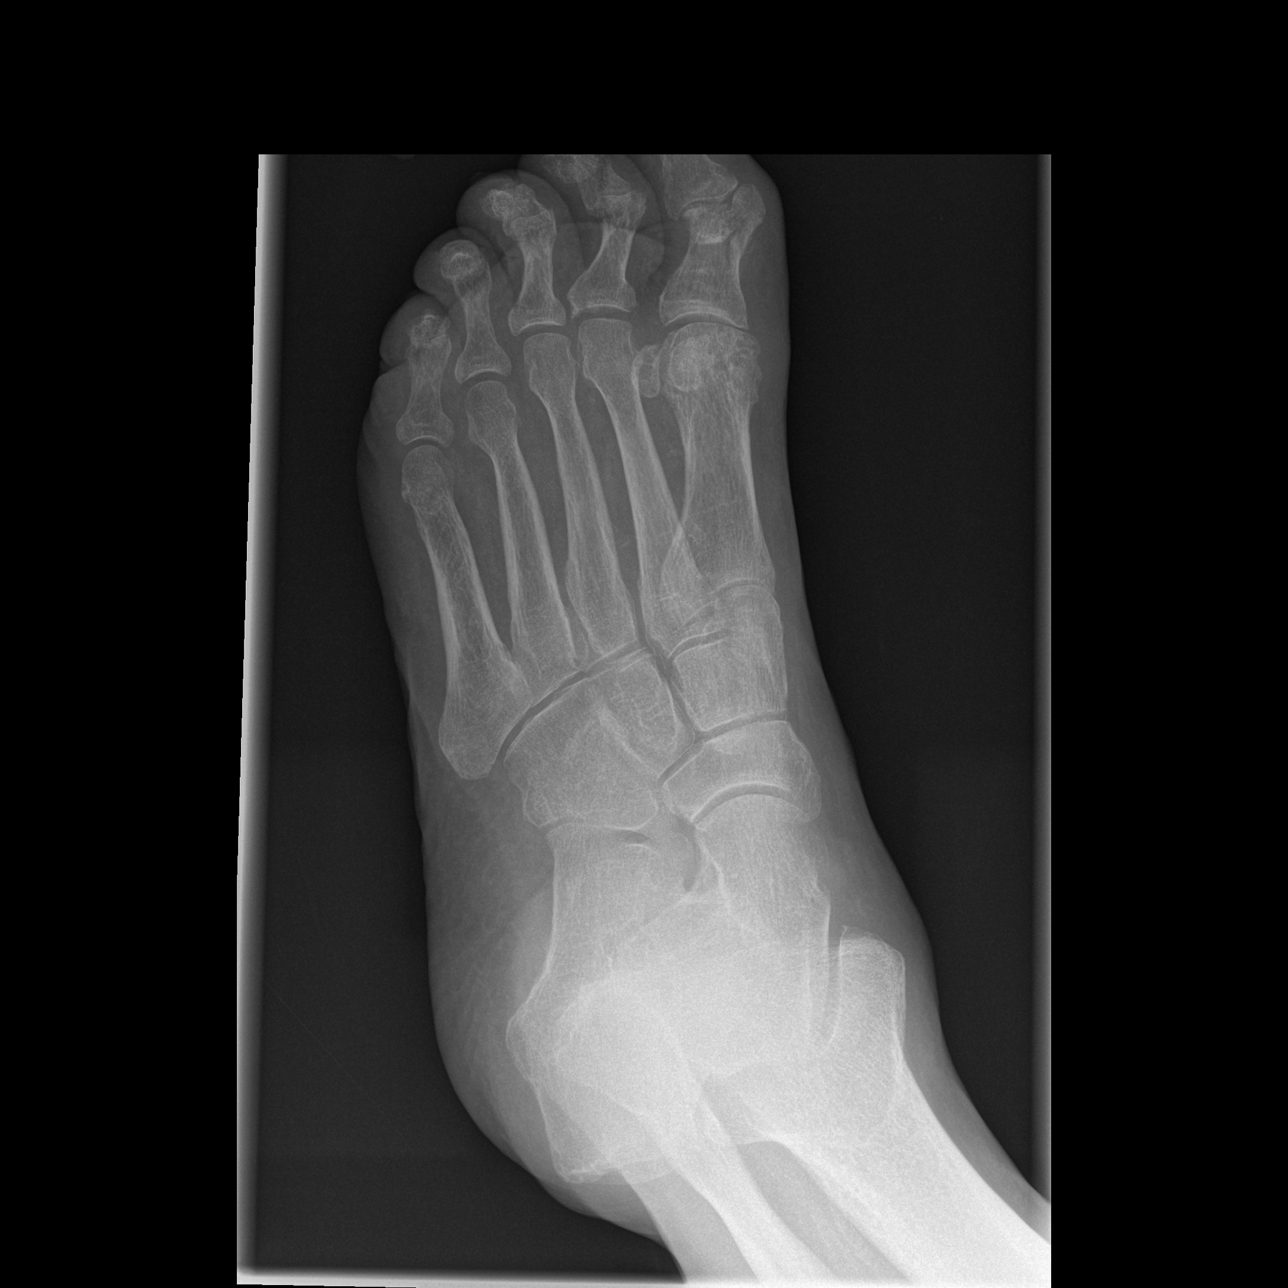

[t foot lat left]
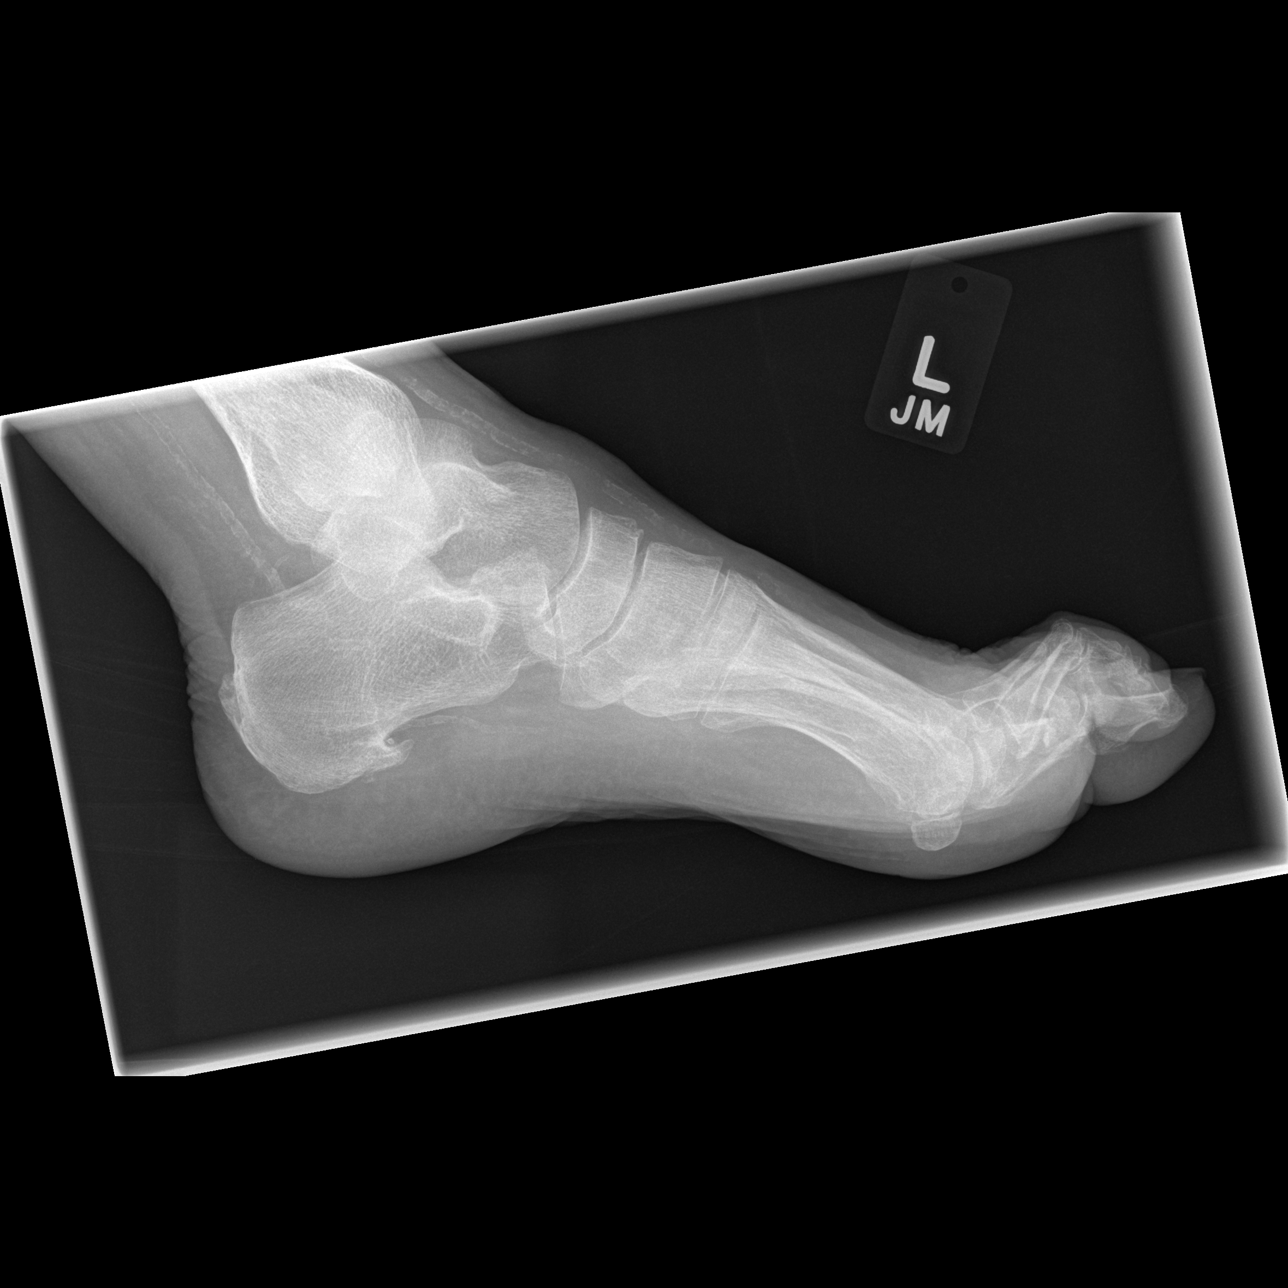

[3 of 3 positions shown; findings below may reference images not displayed]

FINDINGS: There is a comminuted, slightly displaced fracture involving the
first proximal phalanx, with intra-articular extension into the
first IP joint. Mild degenerative changes of the first MTP joint.
Osteopenia. Diffuse vascular calcifications. Large plantar
enthesophyte. Mild soft tissue swelling of the great toe.
IMPRESSION: Mildly comminuted and slightly displaced fracture of the first
proximal phalanx, extending into the first IP joint.

## 2018-01-15 ENCOUNTER — Other Ambulatory Visit: Payer: Self-pay | Admitting: Cardiology

## 2018-06-20 NOTE — Progress Notes (Deleted)
Antonio [name]. Date of Birth: [birth year]   History of Present Illness: [name] is seen for followup Afib.  He has a history of paroxysmal atrial fibrillation managed with rate control and anticoagulation. He is on Pradaxa.  When seen one year ago he was admitted to [name] with acute sciatica. MRI of spine and sacrum done. Seen by Neurosurgery.  He was treated with conservative therapy with steroids, Neurontin, and methocarbamol.   He is seen in a wheelchair today. Family notes his speech is more slurred/difficult to understand since he was in the hospital.  He denies any chest pain, palpitations, or shortness of breath. No bleeding problems on Pradaxa.   Current Outpatient Medications on File Prior to Visit  Medication Sig Dispense Refill  . acetaminophen (TYLENOL) 325 MG tablet Take 650 mg by mouth every 6 (six) hours as needed for mild pain or moderate pain.     Marland Kitchen amLODipine (NORVASC) 5 MG tablet TAKE 1 TABLET BY MOUTH EVERY DAY 30 tablet 11  . Ascorbic Acid (VITAMIN C) 500 MG tablet Take 1 tablet (500 mg total) by mouth daily. 30 tablet   . atorvastatin (LIPITOR) 10 MG tablet Take 1 tablet (10 mg total) by mouth daily. 90 tablet 3  . Coenzyme Q10 (COQ10) 200 MG CAPS Take 1 capsule by mouth daily.     . dabigatran (PRADAXA) 75 MG CAPS capsule Take 1 capsule (75 mg total) by mouth 2 (two) times daily. 60 capsule 11  . doxycycline (VIBRAMYCIN) 100 MG capsule Take 1 capsule (100 mg total) by mouth 2 (two) times daily. 14 capsule 0  . FOLBIC 2.5-25-2 MG TABS tablet TAKE 1 TABLET BY MOUTH EVERY DAY 30 tablet 11  . gabapentin (NEURONTIN) 300 MG capsule Take 300 mg by mouth 3 (three) times daily.    . hydrochlorothiazide (MICROZIDE) 12.5 MG capsule TAKE 1 CAPSULE DAILY 30 capsule 6  . HYDROcodone-acetaminophen (LORTAB 5) 5-500 MG per tablet Take 1 tablet by mouth every 6 (six) hours as needed for pain.    Marland Kitchen lisinopril (PRINIVIL,ZESTRIL) 20 MG tablet TAKE 1 TABLET BY MOUTH  EVERY DAY 30 tablet 6  . pantoprazole (PROTONIX) 40 MG tablet Take 1 tablet (40 mg total) by mouth daily. 60 tablet 6   No current facility-administered medications on file prior to visit.     No Known Allergies  Past Medical History:  Diagnosis Date  . Chronic anticoagulation    Replaced with Pradaxa  . CKD (chronic kidney disease), stage IV ([name])    no nephrologist  . Coronary artery disease   . Dyslipidemia   . Elevated serum creatinine    improved with gentle hydration  . Hypercholesterolemia   . Hyperlipidemia   . Hypertension   . Hypotension    Postoperative hypotension improved with holding medication  . PAF (paroxysmal atrial fibrillation) ([name])   . Spinal stenosis     [name]  . Stroke [name]) 5-6 yrs ago    Status post left hemisphere stroke with right-sided weakness    Past Surgical History:  Procedure Laterality Date  . BACK SURGERY   5 yrs ago   lower  . CATARACT EXTRACTION Bilateral yrs ago  . CHOLECYSTECTOMY  yrs ago  . PARTIAL PROCTECTOMY BY TEM N/A 08/05/2013   Procedure: rigid proctoscopy, flexible sigmoidoscopy, proctectomy by transanal endoscopic microsurgery;  Surgeon: Leighton Ruff, MD;  Location: [name];  Service: General;  Laterality: N/A;    Social History   Tobacco Use  Smoking Status  Former Smoker  . Packs/day: 1.00  . Years: 8.00  . Pack years: 8.00  . Types: Cigarettes  . Last attempt to quit: 05/16/1968  . Years since quitting: 50.1  Smokeless Tobacco Never Used    Social History   Substance and Sexual Activity  Alcohol Use No    Family History  Problem Relation Age of Onset  . Hypertension Mother   . Stroke Mother   . Heart disease Mother   . Heart disease Father   . Diabetes Father   . Cancer Father         history of prostate, bladder and colon cancer  . Diabetes Brother   . Diabetes Brother     Review of System: As noted in history of present illness.  All other systems were reviewed and are negative.  Physical  Exam: There were no vitals taken for this visit. GENERAL:  Elderly WM seen in wheelchair.  HEENT:  PERRL, EOMI, sclera are clear. Oropharynx is clear. NECK:  No jugular venous distention, carotid upstroke brisk and symmetric, no bruits, no thyromegaly or adenopathy LUNGS:  Clear to auscultation bilaterally CHEST:  Unremarkable HEART:  IRRR,  PMI not displaced or sustained,S1 and S2 within normal limits, no S3, no S4: no clicks, no rubs, no murmurs ABD:  Soft, nontender. BS +, no masses or bruits. No hepatomegaly, no splenomegaly EXT:  2 + pulses throughout, no edema, no cyanosis no clubbing SKIN:  Warm and [name].  No rashes NEURO:  Alert and oriented x 3. Speech is somewhat slurred.  PSYCH:  Cognitively intact    LABORATORY DATA: Labs reviewed from primary care. 11/06/15: BUN 29, creatinine 1.76. Other chemistries normal. Cholesterol 108, triglycerides 47, HDL 42, LDL 72. CBC normal Dated 11/21/16: creatinine 1.85. BUN 34. Other chemistries are normal. CBC normal. Dated 05/26/17: BUN 58, creatinine 1.96. Glucose 131. Hgb 12.3.   Ecg today shows NSR with PACs rate 68. Otherwise normal.  I have personally reviewed and interpreted this study.  Assessment / Plan: 1.Paroxysmal Atrial fibrillation. Asymptomatic.  Continue Pradaxa. He has CKD stage 3-4. Given his CKD and age I have recommended continuing Pradaxa  75 mg bid.   2. HTN controlled.  3. Hyperlipidemia. On simvastatin. Given interaction with amlodipine I have recommended switching simvastatin to lipitor 10 mg daily.   4. CKD stage 3-4.   5. Acute sciatica. Follow up with Neurosurgery. If needed I think his risk for surgery would be low and he could interrupt Pradaxa for 72 hours prior to surgery. I think his more slurred speech may be more related to gabapentin and methocarbamol. Will monitor.   I will follow up in one year

## 2018-06-25 ENCOUNTER — Ambulatory Visit: Payer: Medicare Other | Admitting: Cardiology

## 2018-07-22 ENCOUNTER — Other Ambulatory Visit: Payer: Self-pay | Admitting: Cardiology

## 2018-07-24 NOTE — Telephone Encounter (Signed)
Rx(s) sent to pharmacy electronically.  

## 2018-08-17 ENCOUNTER — Other Ambulatory Visit: Payer: Self-pay

## 2018-08-17 MED ORDER — FA-PYRIDOXINE-CYANOCOBALAMIN 2.5-25-2 MG PO TABS: 1.0000 | ORAL_TABLET | Freq: Every day | ORAL | 3 refills | Status: DC

## 2018-08-22 ENCOUNTER — Encounter: Payer: Self-pay | Admitting: *Deleted

## 2018-08-22 ENCOUNTER — Telehealth: Payer: Self-pay | Admitting: *Deleted

## 2018-08-22 NOTE — Telephone Encounter (Signed)
   Cardiac Questionnaire:    Since your last visit or hospitalization:    1. Have you been having new or worsening chest pain? NO   2. Have you been having new or worsening shortness of breath? NO 3. Have you been having new or worsening leg swelling, wt gain, or increase in abdominal girth (pants fitting more tightly)? NO   4. Have you had any passing out spells? NO    *A YES to any of these questions would result in the appointment being kept. *If all the answers to these questions are NO, we should indicate that given the current situation regarding the worldwide coronarvirus pandemic, at the recommendation of the CDC, we are looking to limit gatherings in our waiting area, and thus will reschedule their appointment beyond four weeks from today.   _____________   [id number] Pre-Screening Questions:  . Do you currently have a fever? NO . Have you recently travelled on a cruise, internationally, or to [place], [place], [place], [place], [place], or [place], [place] [employer])? NO . Have you been in contact with someone that is currently pending confirmation of Covid19 testing or has been confirmed to have the [place] virus? NO Are you currently experiencing fatigue or cough? NO    Spoke with patient and he is aware of his telephone visit with Dr. Martinique at @ 2:40 pm Friday August 24, 2018. We also went over his medications, pharmacy, and history.

## 2018-08-23 ENCOUNTER — Telehealth: Payer: Self-pay | Admitting: Cardiology

## 2018-08-23 NOTE — Telephone Encounter (Signed)
Smart phone/Declined MyChart/pre reg complete. 08-23-18. ST °

## 2018-08-24 ENCOUNTER — Telehealth (INDEPENDENT_AMBULATORY_CARE_PROVIDER_SITE_OTHER): Payer: Medicare Other | Admitting: Cardiology

## 2018-08-24 ENCOUNTER — Encounter: Payer: Self-pay | Admitting: Cardiology

## 2018-08-24 VITALS — Wt 185.0 lb

## 2018-08-24 DIAGNOSIS — Z7189 Other specified counseling: Secondary | ICD-10-CM

## 2018-08-24 DIAGNOSIS — I480 Paroxysmal atrial fibrillation: Secondary | ICD-10-CM

## 2018-08-24 DIAGNOSIS — E7800 Pure hypercholesterolemia, unspecified: Secondary | ICD-10-CM

## 2018-08-24 DIAGNOSIS — I10 Essential (primary) hypertension: Secondary | ICD-10-CM

## 2018-08-24 NOTE — Progress Notes (Signed)
Virtual Visit via Video Note   This visit type was conducted due to national recommendations for restrictions regarding the COVID-19 Pandemic (e.g. social distancing) in an effort to limit this patient's exposure and mitigate transmission in our community.  Due to his co-morbid illnesses, this patient is at least at moderate risk for complications without adequate follow up.  This format is felt to be most appropriate for this patient at this time.  All issues noted in this document were discussed and addressed.  A limited physical exam was performed with this format.  Please refer to the patient's chart for his consent to telehealth for [facility].   Evaluation Performed:  Follow-up visit  Date:  08/24/2018   ID:  Antonio Dry., DOB 1940-11-29, MRN [id number]  Patient Location: Home  Provider Location: Office  PCP:  Christain Sacramento, MD  Cardiologist:  Antonio Martinique, MD  Electrophysiologist:  None   Chief Complaint:  Follow up Afib  History of Present Illness:    Antonio Blasius. is a 78 y.o. male who presents via audio/video conferencing for a telehealth visit today.  He has a history of paroxysmal atrial fibrillation managed with rate control and anticoagulation. He is on Pradaxa. Since his last visit with me he underwent lumbar laminectomy last year. He reports this went well. He was in a wheelchair but is now able to walk with a walker. He denies any cardiac complaints. No chest pain, palpitations, dizziness, edema or SOB. 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   The patient does not have symptoms concerning for COVID-19 infection (fever, chills, cough, or new shortness of breath).    Past Medical History:  Diagnosis Date  . Chronic anticoagulation    Replaced with Pradaxa  . CKD (chronic kidney disease), stage IV ([place])    no nephrologist  . Coronary artery disease   . Dyslipidemia   . Elevated serum creatinine    improved with gentle hydration  .  Hypercholesterolemia   . Hyperlipidemia   . Hypertension   . Hypotension    Postoperative hypotension improved with holding medication  . PAF (paroxysmal atrial fibrillation) ([place])   . Spinal stenosis     L4-5  . Stroke [facility]) 5-6 yrs ago    Status post left hemisphere stroke with right-sided weakness   Past Surgical History:  Procedure Laterality Date  . BACK SURGERY   5 yrs ago   lower  . CATARACT EXTRACTION Bilateral yrs ago  . CHOLECYSTECTOMY  yrs ago  . PARTIAL PROCTECTOMY BY TEM N/A 08/05/2013   Procedure: rigid proctoscopy, flexible sigmoidoscopy, proctectomy by transanal endoscopic microsurgery;  Surgeon: Leighton Ruff, MD;  Location: [facility];  Service: General;  Laterality: N/A;     Current Meds  Medication Sig  . acetaminophen (TYLENOL) 325 MG tablet Take 650 mg by mouth every 6 (six) hours as needed for mild pain or moderate pain.   Marland Kitchen amLODipine (NORVASC) 5 MG tablet TAKE 1 TABLET BY MOUTH EVERY DAY  . Ascorbic Acid (VITAMIN C) 500 MG tablet Take 1 tablet (500 mg total) by mouth daily.  . Coenzyme Q10 (COQ10) 200 MG CAPS Take 1 capsule by mouth daily.   . dabigatran (PRADAXA) 75 MG CAPS capsule Take 1 capsule (75 mg total) by mouth 2 (two) times daily.  Marland Kitchen doxycycline (VIBRAMYCIN) 100 MG capsule Take 1 capsule (100 mg total) by mouth 2 (two) times daily.  . folic acid-pyridoxine-cyancobalamin (FOLBIC) 2.5-25-2 MG TABS tablet Take 1 tablet by mouth  daily. MUST KEEP APPOINTMENT 08/24/18 WITH DR Martinique FOR FUTURE REFILLS  . gabapentin (NEURONTIN) 300 MG capsule Take 300 mg by mouth 3 (three) times daily.  . hydrochlorothiazide (MICROZIDE) 12.5 MG capsule TAKE 1 CAPSULE DAILY  . lisinopril (PRINIVIL,ZESTRIL) 20 MG tablet TAKE 1 TABLET BY MOUTH EVERY DAY  . pantoprazole (PROTONIX) 40 MG tablet Take 1 tablet (40 mg total) by mouth daily.     Allergies:   Patient has no known allergies.   Social History   Tobacco Use  . Smoking status: Former Smoker    Packs/day: 1.00     Years: 8.00    Pack years: 8.00    Types: Cigarettes    Last attempt to quit: 05/16/1968    Years since quitting: 50.3  . Smokeless tobacco: Never Used  Substance Use Topics  . Alcohol use: No  . Drug use: No     Family Hx: The patient's family history includes Cancer in his father; Diabetes in his brother, brother, and father; Heart disease in his father and mother; Hypertension in his mother; Stroke in his mother.  ROS:   Please see the history of present illness.    All other systems reviewed and are negative.   Prior CV studies:   The following studies were reviewed today:  none  Labs/Other Tests and Data Reviewed:    EKG:  No ECG reviewed.  Recent Labs: No results found for requested labs within last 8760 hours.   Recent Lipid Panel Lab Results  Component Value Date/Time   CHOL 94 10/20/2011 11:47 AM   TRIG 57.0 10/20/2011 11:47 AM   HDL 39.80 10/20/2011 11:47 AM   CHOLHDL 2 10/20/2011 11:47 AM   LDLCALC 43 10/20/2011 11:47 AM   Dated 11/13/17: BUN 80, creatinine 2.66. Hgb 13.2.   Wt Readings from Last 3 Encounters:  08/24/18 185 lb (83.9 kg)  06/05/17 180 lb (81.6 kg)  03/06/17 185 lb (83.9 kg)     Objective:    Vital Signs:  Wt 185 lb (83.9 kg)   BMI 27.32 kg/m    Well nourished, well developed male in no acute distress. Speech is somewhat slurred. He has evidence of imbalance and is shaky. Oriented x 3.  Breathing is unlabored No edema Color is good.   ASSESSMENT & PLAN:    1.Paroxysmal Atrial fibrillation. Asymptomatic.  Continue Pradaxa at lower dose based on age and CKD.  2. HTN patient reports good control.  3. Hyperlipidemia. was previously on simvastatin but switched last year to atorvastatin due to interaction with amlodipine. Recommend he have a lipid panel and LFTs done the next time he has lab done with Dr. Redmond Pulling.  4. CKD stage 3-4   COVID-19 Education: The signs and symptoms of COVID-19 were discussed with the patient and how  to seek care for testing (follow up with PCP or arrange E-visit).  The importance of social distancing was discussed today.  Time:   Today, I have spent 10 minutes with the patient with telehealth technology discussing the above problems.     Medication Adjustments/Labs and Tests Ordered: Current medicines are reviewed at length with the patient today.  Concerns regarding medicines are outlined above.  Tests Ordered: No orders of the defined types were placed in this encounter.  Medication Changes: No orders of the defined types were placed in this encounter.   Disposition:  Follow up in 1 year(s)  Signed, Antonio Martinique, MD  08/24/2018 2:50 PM    [facility]  

## 2018-08-24 NOTE — Patient Instructions (Signed)
Medication Instructions:  Continue same medications If you need a refill on your cardiac medications before your next appointment, please call your pharmacy.   Lab work: None ordered   Testing/Procedures: None ordered  Follow-Up: At [facility], you and your health needs are our priority.  As part of our continuing mission to provide you with exceptional heart care, we have created designated Provider Care Teams.  These Care Teams include your primary Cardiologist (physician) and Advanced Practice Providers (APPs -  Physician Assistants and Nurse Practitioners) who all work together to provide you with the care you need, when you need it. . Schedule follow up appointment with Dr.Jordan in 12 months.  Call 3 months before to schedule.

## 2018-10-22 ENCOUNTER — Other Ambulatory Visit: Payer: Self-pay

## 2018-10-22 MED ORDER — PANTOPRAZOLE SODIUM 40 MG PO TBEC: 40.0000 mg | DELAYED_RELEASE_TABLET | Freq: Every day | ORAL | 10 refills | Status: DC

## 2018-10-22 NOTE — Telephone Encounter (Signed)
Rx(s) sent to pharmacy electronically.  

## 2018-12-28 ENCOUNTER — Other Ambulatory Visit: Payer: Self-pay

## 2018-12-28 MED ORDER — FOLBIC 2.5-25-2 MG PO TABS: 1.0000 | ORAL_TABLET | Freq: Every day | ORAL | 3 refills | Status: DC

## 2019-04-23 ENCOUNTER — Other Ambulatory Visit: Payer: Self-pay | Admitting: *Deleted

## 2019-04-23 MED ORDER — FOLBIC 2.5-25-2 MG PO TABS: 1.0000 | ORAL_TABLET | Freq: Every day | ORAL | 4 refills | Status: DC

## 2019-04-23 NOTE — Telephone Encounter (Signed)
Rx has been sent to the pharmacy electronically. ° °

## 2019-09-06 NOTE — Progress Notes (Deleted)
Virtual Visit via Video Note   This visit type was conducted due to national recommendations for restrictions regarding the COVID-19 Pandemic (e.g. social distancing) in an effort to limit this patient's exposure and mitigate transmission in our community.  Due to his co-morbid illnesses, this patient is at least at moderate risk for complications without adequate follow up.  This format is felt to be most appropriate for this patient at this time.  All issues noted in this document were discussed and addressed.  A limited physical exam was performed with this format.  Please refer to the patient's chart for his consent to telehealth for [facility].   Evaluation Performed:  Follow-up visit  Date:  09/06/2019   ID:  Antonio [name]., DOB 1941/01/28, MRN [id number]  Patient Location: Home  Provider Location: Office  PCP:  Christain Sacramento, MD  Cardiologist:  Vallorie Niccoli Martinique, MD  Electrophysiologist:  None   Chief Complaint:  Follow up Afib  History of Present Illness:    Antonio Trulson. is a 79 y.o. male who presents via audio/video conferencing for a telehealth visit today.  He has a history of paroxysmal atrial fibrillation managed with rate control and anticoagulation. He is on Pradaxa. Since his last visit with me he underwent lumbar laminectomy last year. He reports this went well. He was in a wheelchair but is now able to walk with a walker. He denies any cardiac complaints. No chest pain, palpitations, dizziness, edema or SOB. 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   The patient does not have symptoms concerning for COVID-19 infection (fever, chills, cough, or new shortness of breath).    Past Medical History:  Diagnosis Date  . Chronic anticoagulation    Replaced with Pradaxa  . CKD (chronic kidney disease), stage IV ([place])    no nephrologist  . Coronary artery disease   . Dyslipidemia   . Elevated serum creatinine    improved with gentle hydration  .  Hypercholesterolemia   . Hyperlipidemia   . Hypertension   . Hypotension    Postoperative hypotension improved with holding medication  . PAF (paroxysmal atrial fibrillation) ([place])   . Spinal stenosis     L4-5  . Stroke [facility]) 5-6 yrs ago    Status post left hemisphere stroke with right-sided weakness   Past Surgical History:  Procedure Laterality Date  . BACK SURGERY   5 yrs ago   lower  . CATARACT EXTRACTION Bilateral yrs ago  . CHOLECYSTECTOMY  yrs ago  . PARTIAL PROCTECTOMY BY TEM N/A 08/05/2013   Procedure: rigid proctoscopy, flexible sigmoidoscopy, proctectomy by transanal endoscopic microsurgery;  Surgeon: Leighton Ruff, MD;  Location: [facility];  Service: General;  Laterality: N/A;     No outpatient medications have been marked as taking for the 09/13/19 encounter (Appointment) with Martinique, Demarquez Ciolek M, MD.     Allergies:   Patient has no known allergies.   Social History   Tobacco Use  . Smoking status: Former Smoker    Packs/day: 1.00    Years: 8.00    Pack years: 8.00    Types: Cigarettes    Quit date: 05/16/1968    Years since quitting: 51.3  . Smokeless tobacco: Never Used  Substance Use Topics  . Alcohol use: No  . Drug use: No     Family Hx: The patient's family history includes Cancer in his father; Diabetes in his brother, brother, and father; Heart disease in his father and mother; Hypertension in his  mother; Stroke in his mother.  ROS:   Please see the history of present illness.    All other systems reviewed and are negative.   Prior CV studies:   The following studies were reviewed today:  none  Labs/Other Tests and Data Reviewed:    EKG:  No ECG reviewed.  Recent Labs: No results found for requested labs within last 8760 hours.   Recent Lipid Panel Lab Results  Component Value Date/Time   CHOL 94 10/20/2011 11:47 AM   TRIG 57.0 10/20/2011 11:47 AM   HDL 39.80 10/20/2011 11:47 AM   CHOLHDL 2 10/20/2011 11:47 AM   LDLCALC 43 10/20/2011  11:47 AM   Dated 11/13/17: BUN 80, creatinine 2.66. Hgb 13.2.   Wt Readings from Last 3 Encounters:  08/24/18 185 lb (83.9 kg)  06/05/17 180 lb (81.6 kg)  03/06/17 185 lb (83.9 kg)     Objective:    Vital Signs:  There were no vitals taken for this visit.   Well nourished, well developed male in no acute distress. Speech is somewhat slurred. He has evidence of imbalance and is shaky. Oriented x 3.  Breathing is unlabored No edema Color is good.   ASSESSMENT & PLAN:    1.Paroxysmal Atrial fibrillation. Asymptomatic.  Continue Pradaxa at lower dose based on age and CKD.  2. HTN patient reports good control.  3. Hyperlipidemia. was previously on simvastatin but switched last year to atorvastatin due to interaction with amlodipine. Recommend he have a lipid panel and LFTs done the next time he has lab done with Dr. Redmond Pulling.  4. CKD stage 3-4   COVID-19 Education: The signs and symptoms of COVID-19 were discussed with the patient and how to seek care for testing (follow up with PCP or arrange E-visit).  The importance of social distancing was discussed today.  Time:   Today, I have spent 10 minutes with the patient with telehealth technology discussing the above problems.     Medication Adjustments/Labs and Tests Ordered: Current medicines are reviewed at length with the patient today.  Concerns regarding medicines are outlined above.  Tests Ordered: No orders of the defined types were placed in this encounter.  Medication Changes: No orders of the defined types were placed in this encounter.   Disposition:  Follow up in 1 year(s)  Signed, Christophere Hillhouse Martinique, MD  09/06/2019 6:52 AM    [facility]

## 2019-09-12 ENCOUNTER — Telehealth: Payer: Self-pay

## 2019-09-12 NOTE — Telephone Encounter (Signed)
  Patient Consent for Virtual Visit         Antonio [name]. has provided verbal consent on 09/12/2019 for a virtual visit (video or telephone).   CONSENT FOR VIRTUAL VISIT FOR:  Antonio [name].  By participating in this virtual visit I agree to the following:  I hereby voluntarily request, consent and authorize [facility] and its employed or contracted physicians, physician assistants, nurse practitioners or other licensed health care professionals (the Practitioner), to provide me with telemedicine health care services (the "Services") as deemed necessary by the treating Practitioner. I acknowledge and consent to receive the Services by the Practitioner via telemedicine. I understand that the telemedicine visit will involve communicating with the Practitioner through live audiovisual communication technology and the disclosure of certain medical information by electronic transmission. I acknowledge that I have been given the opportunity to request an in-person assessment or other available alternative prior to the telemedicine visit and am voluntarily participating in the telemedicine visit.  I understand that I have the right to withhold or withdraw my consent to the use of telemedicine in the course of my care at any time, without affecting my right to future care or treatment, and that the Practitioner or I may terminate the telemedicine visit at any time. I understand that I have the right to inspect all information obtained and/or recorded in the course of the telemedicine visit and may receive copies of available information for a reasonable fee.  I understand that some of the potential risks of receiving the Services via telemedicine include:  Marland Kitchen Delay or interruption in medical evaluation due to technological equipment failure or disruption; . Information transmitted may not be sufficient (e.g. poor resolution of images) to allow for appropriate medical decision making by the  Practitioner; and/or  . In rare instances, security protocols could fail, causing a breach of personal health information.  Furthermore, I acknowledge that it is my responsibility to provide information about my medical history, conditions and care that is complete and accurate to the best of my ability. I acknowledge that Practitioner's advice, recommendations, and/or decision may be based on factors not within their control, such as incomplete or inaccurate data provided by me or distortions of diagnostic images or specimens that may result from electronic transmissions. I understand that the practice of medicine is not an exact science and that Practitioner makes no warranties or guarantees regarding treatment outcomes. I acknowledge that a copy of this consent can be made available to me via my patient portal ([facility]), or I can request a printed copy by calling the office of [facility].    I understand that my insurance will be billed for this visit.   I have read or had this consent read to me. . I understand the contents of this consent, which adequately explains the benefits and risks of the Services being provided via telemedicine.  . I have been provided ample opportunity to ask questions regarding this consent and the Services and have had my questions answered to my satisfaction. . I give my informed consent for the services to be provided through the use of telemedicine in my medical care

## 2019-09-13 ENCOUNTER — Telehealth (INDEPENDENT_AMBULATORY_CARE_PROVIDER_SITE_OTHER): Payer: Medicare Other | Admitting: Cardiology

## 2019-09-13 ENCOUNTER — Telehealth: Payer: Medicare Other | Admitting: Cardiology

## 2019-09-13 ENCOUNTER — Encounter: Payer: Self-pay | Admitting: Cardiology

## 2019-09-13 VITALS — BP 121/61 | HR 66 | Ht 69.0 in | Wt 185.0 lb

## 2019-09-13 DIAGNOSIS — E7800 Pure hypercholesterolemia, unspecified: Secondary | ICD-10-CM

## 2019-09-13 DIAGNOSIS — N184 Chronic kidney disease, stage 4 (severe): Secondary | ICD-10-CM

## 2019-09-13 DIAGNOSIS — I480 Paroxysmal atrial fibrillation: Secondary | ICD-10-CM

## 2019-09-13 DIAGNOSIS — I10 Essential (primary) hypertension: Secondary | ICD-10-CM

## 2019-09-13 NOTE — Patient Instructions (Signed)
Medication Instructions:  Continue same medications *If you need a refill on your cardiac medications before your next appointment, please call your pharmacy*   Lab Work: None ordered    Testing/Procedures: None ordered   Follow-Up: At [facility], you and your health needs are our priority.  As part of our continuing mission to provide you with exceptional heart care, we have created designated Provider Care Teams.  These Care Teams include your primary Cardiologist (physician) and Advanced Practice Providers (APPs -  Physician Assistants and Nurse Practitioners) who all work together to provide you with the care you need, when you need it.  We recommend signing up for the patient portal called "MyChart".  Sign up information is provided on this After Visit Summary.  MyChart is used to connect with patients for Virtual Visits (Telemedicine).  Patients are able to view lab/test results, encounter notes, upcoming appointments, etc.  Non-urgent messages can be sent to your provider as well.   To learn more about what you can do with MyChart, go to [email or web address].      Your next appointment: 1 year    Call in Feb to schedule April appointment     The format for your next appointment: Office    Provider: Dr.Jordan

## 2019-09-13 NOTE — Progress Notes (Signed)
Virtual Visit via Video Note   This visit type was conducted due to national recommendations for restrictions regarding the COVID-19 Pandemic (e.g. social distancing) in an effort to limit this patient's exposure and mitigate transmission in our community.  Due to his co-morbid illnesses, this patient is at least at moderate risk for complications without adequate follow up.  This format is felt to be most appropriate for this patient at this time.  All issues noted in this document were discussed and addressed.  A limited physical exam was performed with this format.  Please refer to the patient's chart for his consent to telehealth for [facility].   Evaluation Performed:  Follow-up visit  Date:  09/13/2019   ID:  Antonio Dry., DOB [birth year], MRN [id number]  Patient Location: Home  Provider Location: Office  PCP:  Christain Sacramento, MD  Cardiologist:  Aira Sallade Martinique, MD  Electrophysiologist:  None   Chief Complaint:  Follow up Afib  History of Present Illness:    Antonio Dempster. is a 79 y.o. male who presents via audio/video conferencing for a telehealth visit today.  He has a history of paroxysmal atrial fibrillation managed with rate control and anticoagulation. He is on Pradaxa.    He denies any cardiac complaints. No chest pain, palpitations, dizziness, edema or SOB. No bleeding problems on Pradaxa. Walks with a walker and still drives. Hasn't seen his primary care in over a year.  The patient does not have symptoms concerning for COVID-19 infection (fever, chills, cough, or new shortness of breath).    Past Medical History:  Diagnosis Date  . Chronic anticoagulation    Replaced with Pradaxa  . CKD (chronic kidney disease), stage IV ([place])    no nephrologist  . Coronary artery disease   . Dyslipidemia   . Elevated serum creatinine    improved with gentle hydration  . Hypercholesterolemia   . Hyperlipidemia   . Hypertension   . Hypotension    Postoperative  hypotension improved with holding medication  . PAF (paroxysmal atrial fibrillation) ([place])   . Spinal stenosis     L4-5  . Stroke [facility]) 5-6 yrs ago    Status post left hemisphere stroke with right-sided weakness   Past Surgical History:  Procedure Laterality Date  . BACK SURGERY   5 yrs ago   lower  . CATARACT EXTRACTION Bilateral yrs ago  . CHOLECYSTECTOMY  yrs ago  . PARTIAL PROCTECTOMY BY TEM N/A 08/05/2013   Procedure: rigid proctoscopy, flexible sigmoidoscopy, proctectomy by transanal endoscopic microsurgery;  Surgeon: Leighton Ruff, MD;  Location: [facility];  Service: General;  Laterality: N/A;     Current Meds  Medication Sig  . acetaminophen (TYLENOL) 325 MG tablet Take 650 mg by mouth every 6 (six) hours as needed for mild pain or moderate pain.   Marland Kitchen amLODipine (NORVASC) 5 MG tablet TAKE 1 TABLET BY MOUTH EVERY DAY  . Ascorbic Acid (VITAMIN C) 500 MG tablet Take 1 tablet (500 mg total) by mouth daily.  . Coenzyme Q10 (COQ10) 200 MG CAPS Take 1 capsule by mouth daily.   . dabigatran (PRADAXA) 75 MG CAPS capsule Take 1 capsule (75 mg total) by mouth 2 (two) times daily.  . folic acid-pyridoxine-cyancobalamin (FOLBIC) 2.5-25-2 MG TABS tablet Take 1 tablet by mouth daily.  . furosemide (LASIX) 40 MG tablet Take 1/2 tablet daily if needed  . gabapentin (NEURONTIN) 300 MG capsule Take 300 mg twice a day  . hydrochlorothiazide (MICROZIDE) 12.5  MG capsule TAKE 1 CAPSULE DAILY  . HYDROcodone-acetaminophen (LORTAB 5) 5-500 MG per tablet Take 1 tablet by mouth every 6 (six) hours as needed for pain.  Marland Kitchen lisinopril (PRINIVIL,ZESTRIL) 20 MG tablet TAKE 1 TABLET BY MOUTH EVERY DAY  . Melatonin 10 MG CAPS Take 10 mg at bedtime  . pantoprazole (PROTONIX) 40 MG tablet Take 1 tablet (40 mg total) by mouth daily.     Allergies:   Patient has no known allergies.   Social History   Tobacco Use  . Smoking status: Former Smoker    Packs/day: 1.00    Years: 8.00    Pack years: 8.00    Types:  Cigarettes    Quit date: 05/16/1968    Years since quitting: 51.3  . Smokeless tobacco: Never Used  Substance Use Topics  . Alcohol use: No  . Drug use: No     Family Hx: The patient's family history includes Cancer in his father; Diabetes in his brother, brother, and father; Heart disease in his father and mother; Hypertension in his mother; Stroke in his mother.  ROS:   Please see the history of present illness.    All other systems reviewed and are negative.   Prior CV studies:   The following studies were reviewed today:  none  Labs/Other Tests and Data Reviewed:    EKG:  No ECG reviewed.  Recent Labs: No results found for requested labs within last 8760 hours.   Recent Lipid Panel Lab Results  Component Value Date/Time   CHOL 94 10/20/2011 11:47 AM   TRIG 57.0 10/20/2011 11:47 AM   HDL 39.80 10/20/2011 11:47 AM   CHOLHDL 2 10/20/2011 11:47 AM   LDLCALC 43 10/20/2011 11:47 AM   Dated 11/13/17: BUN 80, creatinine 2.66. Hgb 13.2.   Wt Readings from Last 3 Encounters:  09/13/19 185 lb (83.9 kg)  08/24/18 185 lb (83.9 kg)  06/05/17 180 lb (81.6 kg)     Objective:    Vital Signs:  BP 121/61   Pulse 66   Ht 5\' 9"  (1.753 m)   Wt 185 lb (83.9 kg)   BMI 27.32 kg/m    Vital signs reviewed.   ASSESSMENT & PLAN:    1.Paroxysmal Atrial fibrillation. Asymptomatic.  Continue Pradaxa at lower dose based on age and CKD.  2. HTN patient reports good control.  3. Hyperlipidemia. was previously on simvastatin but switched last year to atorvastatin due to interaction with amlodipine.   4. CKD stage 3-4  I noted patient hasn't had any lab work since July 2019. Discussed with him. Since he has CKD and is on a DOAC as well as a statin I told him it is imperative that he have follow up lab work done. I offered for him to come into our office for this but he insisted he would contact Dr Redmond Pulling for this.   COVID-19 Education: The signs and symptoms of COVID-19 were  discussed with the patient and how to seek care for testing (follow up with PCP or arrange E-visit).  The importance of social distancing was discussed today.  Time:   Today, I have spent 10 minutes with the patient with telehealth technology discussing the above problems.     Medication Adjustments/Labs and Tests Ordered: Current medicines are reviewed at length with the patient today.  Concerns regarding medicines are outlined above.  Tests Ordered: No orders of the defined types were placed in this encounter.  Medication Changes: No orders of the defined types  were placed in this encounter.   Disposition:  Follow up in 1 year(s)  Signed, Stefhanie Kachmar Martinique, MD  09/13/2019 9:26 AM    [facility]

## 2019-09-23 ENCOUNTER — Other Ambulatory Visit: Payer: Self-pay

## 2019-09-23 MED ORDER — FOLBIC 2.5-25-2 MG PO TABS: 1.0000 | ORAL_TABLET | Freq: Every day | ORAL | 4 refills | Status: DC

## 2019-09-24 ENCOUNTER — Other Ambulatory Visit: Payer: Self-pay

## 2020-02-10 ENCOUNTER — Other Ambulatory Visit: Payer: Self-pay | Admitting: Cardiology

## 2020-07-02 ENCOUNTER — Other Ambulatory Visit: Payer: Self-pay | Admitting: Cardiology

## 2020-12-06 ENCOUNTER — Other Ambulatory Visit: Payer: Self-pay | Admitting: Cardiology

## 2021-01-09 NOTE — Progress Notes (Deleted)
Antonio [name]. Date of Birth: Jan 15, 1941   History of Present Illness: Antonio [name] is seen for followup Afib.  He has a history of paroxysmal atrial fibrillation managed with rate control and anticoagulation. He is on Pradaxa.  He was recently admitted to [facility] with acute sciatica. MRI of spine and sacrum done. Seen by Neurosurgery.  He was treated with conservative therapy with steroids, Neurontin, and methocarbamol. He is seen in a wheelchair today. Family notes his speech is more slurred/difficult to understand since he was in the hospital.  He denies any chest pain, palpitations, or shortness of breath. No bleeding problems on Pradaxa.   Current Outpatient Medications on File Prior to Visit  Medication Sig Dispense Refill   acetaminophen (TYLENOL) 325 MG tablet Take 650 mg by mouth every 6 (six) hours as needed for mild pain or moderate pain.      amLODipine (NORVASC) 5 MG tablet TAKE 1 TABLET BY MOUTH EVERY DAY 30 tablet 11   Ascorbic Acid (VITAMIN C) 500 MG tablet Take 1 tablet (500 mg total) by mouth daily. 30 tablet    atorvastatin (LIPITOR) 10 MG tablet Take 1 tablet (10 mg total) by mouth daily. 90 tablet 3   Coenzyme Q10 (COQ10) 200 MG CAPS Take 1 capsule by mouth daily.      dabigatran (PRADAXA) 75 MG CAPS capsule Take 1 capsule (75 mg total) by mouth 2 (two) times daily. 60 capsule 11   FOLBIC 2.5-25-2 MG TABS tablet TAKE 1 TABLET BY MOUTH DAILY 30 tablet 4   furosemide (LASIX) 40 MG tablet Take 1/2 tablet daily if needed 90 tablet 3   gabapentin (NEURONTIN) 300 MG capsule Take 300 mg twice a day     hydrochlorothiazide (MICROZIDE) 12.5 MG capsule TAKE 1 CAPSULE DAILY 30 capsule 6   HYDROcodone-acetaminophen (LORTAB 5) 5-500 MG per tablet Take 1 tablet by mouth every 6 (six) hours as needed for pain.     lisinopril (PRINIVIL,ZESTRIL) 20 MG tablet TAKE 1 TABLET BY MOUTH EVERY DAY 30 tablet 6   Melatonin 10 MG CAPS Take 10 mg at bedtime 30 capsule     pantoprazole (PROTONIX) 40 MG tablet Take 1 tablet (40 mg total) by mouth daily. 60 tablet 10   No current facility-administered medications on file prior to visit.    No Known Allergies  Past Medical History:  Diagnosis Date   Chronic anticoagulation    Replaced with Pradaxa   CKD (chronic kidney disease), stage IV ([facility])    no nephrologist   Coronary artery disease    Dyslipidemia    Elevated serum creatinine    improved with gentle hydration   Hypercholesterolemia    Hyperlipidemia    Hypertension    Hypotension    Postoperative hypotension improved with holding medication   PAF (paroxysmal atrial fibrillation) ([place])    Spinal stenosis     L4-5   Stroke ([place]) 5-6 yrs ago    Status post left hemisphere stroke with right-sided weakness    Past Surgical History:  Procedure Laterality Date   BACK SURGERY   5 yrs ago   lower   CATARACT EXTRACTION Bilateral yrs ago   CHOLECYSTECTOMY  yrs ago   PARTIAL PROCTECTOMY BY TEM N/A 08/05/2013   Procedure: rigid proctoscopy, flexible sigmoidoscopy, proctectomy by transanal endoscopic microsurgery;  Surgeon: Leighton Ruff, MD;  Location: [facility];  Service: General;  Laterality: N/A;    Social History   Tobacco Use  Smoking Status Former  Packs/day: 1.00   Years: 8.00   Pack years: 8.00   Types: Cigarettes   Quit date: 05/16/1968   Years since quitting: 52.6  Smokeless Tobacco Never    Social History   Substance and Sexual Activity  Alcohol Use No    Family History  Problem Relation Age of Onset   Hypertension Mother    Stroke Mother    Heart disease Mother    Heart disease Father    Diabetes Father    Cancer Father         history of prostate, bladder and colon cancer   Diabetes Brother    Diabetes Brother     Review of System: As noted in history of present illness.  All other systems were reviewed and are negative.  Physical Exam: There were no vitals taken for this visit. GENERAL:  Elderly WM seen in  wheelchair.  HEENT:  PERRL, EOMI, sclera are clear. Oropharynx is clear. NECK:  No jugular venous distention, carotid upstroke brisk and symmetric, no bruits, no thyromegaly or adenopathy LUNGS:  Clear to auscultation bilaterally CHEST:  Unremarkable HEART:  IRRR,  PMI not displaced or sustained,S1 and S2 within normal limits, no S3, no S4: no clicks, no rubs, no murmurs ABD:  Soft, nontender. BS +, no masses or bruits. No hepatomegaly, no splenomegaly EXT:  2 + pulses throughout, no edema, no cyanosis no clubbing SKIN:  Warm and [name].  No rashes NEURO:  Alert and oriented x 3. Speech is somewhat slurred.  PSYCH:  Cognitively intact    LABORATORY DATA: Labs reviewed from primary care. 11/06/15: BUN 29, creatinine 1.76. Other chemistries normal. Cholesterol 108, triglycerides 47, HDL 42, LDL 72. CBC normal Dated 11/21/16: creatinine 1.85. BUN 34. Other chemistries are normal. CBC normal. Dated 05/26/17: BUN 58, creatinine 1.96. Glucose 131. Hgb 12.3.  Dated 07/07/20: cholesterol 111, triglycerides 30, HDL 45, LDL 61, BUN 41, creatinine 2.24. plts 147K otherwise CBC normal.  Ecg today shows NSR with PACs rate 68. Otherwise normal.  I have personally reviewed and interpreted this study.  Assessment / Plan: 1.Paroxysmal Atrial fibrillation. Asymptomatic.  Continue Pradaxa. He has CKD stage 3-4. Given his CKD and age I have recommended continuing Pradaxa  75 mg bid.   2. HTN controlled.  3. Hyperlipidemia. On simvastatin. Given interaction with amlodipine I have recommended switching simvastatin to lipitor 10 mg daily.   4. CKD stage 3-4.   I will follow up in one year

## 2021-01-12 ENCOUNTER — Ambulatory Visit: Payer: Medicare Other | Admitting: Cardiology

## 2021-05-02 ENCOUNTER — Other Ambulatory Visit: Payer: Self-pay | Admitting: Cardiology

## 2021-06-07 ENCOUNTER — Other Ambulatory Visit: Payer: Self-pay | Admitting: Cardiology

## 2021-09-10 NOTE — Progress Notes (Incomplete)
Histology and Location of Primary Skin Cancer:  ?Basal cell carcinoma of left mandible and right nose ?08/04/2021 ? &  ? ? ? ?Antonio [name]. presented with the following signs/symptoms ? ? ?Past/Anticipated interventions by patient's surgeon/dermatologist for current problematic lesion, if any:  ?09/09/2021 ?--Dr. Janan Ridge (office visit) ? ? ?Past skin cancers, if any: ?1) Location/Histology/Intervention: *** ?2) Location/Histology/Intervention: *** ?3) Location/Histology/Intervention: *** ? ?History of Blistering sunburns, if any: *** ? ?SAFETY ISSUES: ?Prior radiation? *** ?Pacemaker/ICD? *** ?Possible current pregnancy? N/A ?Is the patient on methotrexate? *** ? ?Current Complaints / other details:  *** ? ?

## 2021-09-12 NOTE — Progress Notes (Incomplete)
?Radiation Oncology         [phone] ?________________________________ ? ?Initial Outpatient Consultation ? ?Name: Antonio [name]. MRN: [id number]  ?Date: 09/13/2021  DOB: August 13, 1940 ? ?UD:THYHOO, Jama Flavors, MD  Venetia Night, MD  ? ?REFERRING PHYSICIAN: Venetia Night, MD ? ?DIAGNOSIS: No diagnosis found. ? 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 ? Cancer Staging  ?No matching staging information was found for the patient. ? ?CHIEF COMPLAINT: Here to discuss management of skin cancer ? ?HISTORY OF PRESENT ILLNESS::Antonio [name]. is a 81 y.o. male who presents today for consideration of radiation therapy in management of several sites of [place] on his face.  ? ?The patient underwent biopsies of a lesion on his left mandible and nose on 08/04/21 by Dr. Renda Rolls. Pathology revealed basal cell carcinoma from both biopsies; nodular and infiltrative type. (Per encounter notes, the site of BCC to the patient's left mandible is in an area that never healed properly from a shaving cut).   ? ?PREVIOUS RADIATION THERAPY: No ? ?PAST MEDICAL HISTORY:  has a past medical history of Chronic anticoagulation, CKD (chronic kidney disease), stage IV ([place]), Coronary artery disease, Dyslipidemia, Elevated serum creatinine, Hypercholesterolemia, Hyperlipidemia, Hypertension, Hypotension, PAF (paroxysmal atrial fibrillation) ([place]), Spinal stenosis, and Stroke ([place]) (5-6 yrs ago).   ? ?PAST SURGICAL HISTORY: ?Past Surgical History:  ?Procedure Laterality Date  ? BACK SURGERY   5 yrs ago  ? lower  ? CATARACT EXTRACTION Bilateral yrs ago  ? CHOLECYSTECTOMY  yrs ago  ? PARTIAL PROCTECTOMY BY TEM N/A 08/05/2013  ? Procedure: rigid proctoscopy, flexible sigmoidoscopy, proctectomy by transanal endoscopic microsurgery;  Surgeon: Leighton Ruff, MD;  Location: [facility];  Service: General;  Laterality: N/A;  ? ? ?FAMILY HISTORY: family history includes Cancer in his father; Diabetes in his brother, brother, and father; Heart  disease in his father and mother; Hypertension in his mother; Stroke in his mother. ? ?SOCIAL HISTORY:  reports that he quit smoking about 53 years ago. His smoking use included cigarettes. He has a 8.00 pack-year smoking history. He has never used smokeless tobacco. He reports that he does not drink alcohol and does not use drugs. ? ?ALLERGIES: Patient has no known allergies. ? ?MEDICATIONS:  ?Current Outpatient Medications  ?Medication Sig Dispense Refill  ? acetaminophen (TYLENOL) 325 MG tablet Take 650 mg by mouth every 6 (six) hours as needed for mild pain or moderate pain.     ? amLODipine (NORVASC) 5 MG tablet TAKE 1 TABLET BY MOUTH EVERY DAY 30 tablet 11  ? Ascorbic Acid (VITAMIN C) 500 MG tablet Take 1 tablet (500 mg total) by mouth daily. 30 tablet   ? atorvastatin (LIPITOR) 10 MG tablet Take 1 tablet (10 mg total) by mouth daily. 90 tablet 3  ? Coenzyme Q10 (COQ10) 200 MG CAPS Take 1 capsule by mouth daily.     ? dabigatran (PRADAXA) 75 MG CAPS capsule Take 1 capsule (75 mg total) by mouth 2 (two) times daily. 60 capsule 11  ? furosemide (LASIX) 40 MG tablet Take 1/2 tablet daily if needed 90 tablet 3  ? gabapentin (NEURONTIN) 300 MG capsule Take 300 mg twice a day    ? hydrochlorothiazide (MICROZIDE) 12.5 MG capsule TAKE 1 CAPSULE DAILY 30 capsule 6  ? HYDROcodone-acetaminophen (LORTAB 5) 5-500 MG per tablet Take 1 tablet by mouth every 6 (six) hours as needed for pain.    ? lisinopril (PRINIVIL,ZESTRIL) 20 MG tablet TAKE 1 TABLET BY  MOUTH EVERY DAY 30 tablet 6  ? Melatonin 10 MG CAPS Take 10 mg at bedtime 30 capsule   ? pantoprazole (PROTONIX) 40 MG tablet Take 1 tablet (40 mg total) by mouth daily. 60 tablet 10  ? WESTAB MAX 2.5-25-2 MG TABS tablet TAKE 1 TABLET BY MOUTH DAILY 30 tablet 0  ? ?No current facility-administered medications for this encounter.  ? ? ?REVIEW OF SYSTEMS:  Notable for that above. ?  ?PHYSICAL EXAM:  vitals were not taken for this visit.   ?General: Alert and oriented, in no  acute distress *** ?HEENT: Head is normocephalic. Extraocular movements are intact. Oropharynx is clear. ?Neck: Neck is supple, no palpable cervical or supraclavicular lymphadenopathy. ?Heart: Regular in rate and rhythm with no murmurs, rubs, or gallops. ?Chest: Clear to auscultation bilaterally, with no rhonchi, wheezes, or rales. ?Abdomen: Soft, nontender, nondistended, with no rigidity or guarding. ?Extremities: No cyanosis or edema. ?Lymphatics: see Neck Exam ?Skin: No concerning lesions. ?Musculoskeletal: symmetric strength and muscle tone throughout. ?Neurologic: Cranial nerves II through XII are grossly intact. No obvious focalities. Speech is fluent. Coordination is intact. ?Psychiatric: Judgment and insight are intact. Affect is appropriate. ? ? ?ECOG = *** ? ?0 - Asymptomatic (Fully active, able to carry on all predisease activities without restriction) ? ?1 - Symptomatic but completely ambulatory (Restricted in physically strenuous activity but ambulatory and able to carry out work of a light or sedentary nature. For example, light housework, office work) ? ?2 - Symptomatic, <50% in bed during the day (Ambulatory and capable of all self care but unable to carry out any work activities. Up and about more than 50% of waking hours) ? ?3 - Symptomatic, >50% in bed, but not bedbound (Capable of only limited self-care, confined to bed or chair 50% or more of waking hours) ? ?4 - Bedbound (Completely disabled. Cannot carry on any self-care. Totally confined to bed or chair) ? ?5 - Death ? ? Oken MM, Creech RH, Tormey DC, et al. [phone]). "Toxicity and response criteria of the [facility]". [place] Oncol. 5 (6): 649-55 ? ? ?LABORATORY DATA:  ?Lab Results  ?Component Value Date  ? WBC 8.7 08/01/2013  ? HGB 10.0 (L) 08/01/2013  ? HCT 31.3 (L) 08/01/2013  ? MCV 74.0 (L) 08/01/2013  ? PLT 291 08/01/2013  ? ?CMP  ?   ?Component Value Date/Time  ? NA 139 08/01/2013 0940  ? K 4.4 08/01/2013  0940  ? CL 106 08/01/2013 0940  ? CO2 20 08/01/2013 0940  ? GLUCOSE 121 (H) 08/01/2013 0940  ? BUN 38 (H) 08/01/2013 0940  ? CREATININE 2.22 (H) 08/01/2013 0940  ? CALCIUM 8.8 08/01/2013 0940  ? PROT 6.9 10/20/2011 1147  ? ALBUMIN 4.0 10/20/2011 1147  ? AST 18 10/20/2011 1147  ? ALT 14 10/20/2011 1147  ? ALKPHOS 48 10/20/2011 1147  ? BILITOT 1.0 10/20/2011 1147  ? GFRNONAA 28 (L) 08/01/2013 0940  ? GFRAA 32 (L) 08/01/2013 0940  ? ?   ? ?  ?RADIOGRAPHY: No results found. ?   ?IMPRESSION/PLAN:***  ? ? ?On date of service, in total, I spent *** minutes on this encounter. Patient was seen in person. ? ? ?__________________________________________ ? ? ?Eppie Gibson, MD ? ?This document serves as a record of services personally performed by Eppie Gibson, MD. It was created on her behalf by Roney Mans, a trained medical scribe. The creation of this record is based on the scribe's personal observations and the provider's  statements to them. This document has been checked and approved by the attending provider. ? ?

## 2021-09-13 ENCOUNTER — Ambulatory Visit
Admission: RE | Admit: 2021-09-13 | Discharge: 2021-09-13 | Disposition: A | Discharge status: Home / Self Care | Payer: Medicare Other | Source: Ambulatory Visit | Attending: Radiation Oncology | Admitting: Radiation Oncology

## 2021-09-13 ENCOUNTER — Ambulatory Visit: Payer: Medicare Other

## 2021-09-27 NOTE — Progress Notes (Signed)
?Radiation Oncology         [phone] ?________________________________ ? ?Initial Outpatient Consultation ? ?Name: Antonio [name]. MRN: [id number]  ?Date: 09/28/2021  DOB: 09-28-1940 ? ?HU:TMLYYT, Jama Flavors, MD  Venetia Night, MD  ? ?REFERRING PHYSICIAN: Venetia Night, MD ? ?DIAGNOSIS:  ?  ICD-10-CM   ?1. Basal cell carcinoma (BCC) of skin of other part of face  C44.319   ?  ?2. Basal cell carcinoma (BCC) of skin of nose  C44.311   ?  ? ? 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 ? Cancer Staging  ?Basal cell carcinoma (BCC) of skin of nose ?Staging form: Cutaneous Carcinoma of the Head and Neck, AJCC 8th Edition ?- Clinical stage from 09/28/2021: Stage II (cT2, cN0, cM0) - Signed by Eppie Gibson, MD on 09/28/2021 ?Stage prefix: Initial diagnosis ?Extraosseous extension: Absent ? ? ?CHIEF COMPLAINT: Here to discuss management of skin cancer ? ?HISTORY OF PRESENT ILLNESS::Antonio [name]. is a 81 y.o. male who presents today for consideration of radiation therapy in management of several sites of [place] on his face.  ? ?The patient underwent biopsies of a lesion on his left mandible and nose on 08/04/21 by Dr. Renda Rolls. Pathology revealed basal cell carcinoma from both biopsies; nodular and infiltrative type. (Per encounter notes, the site of BCC to the patient's left mandible is in an area that never healed properly from a shaving cut).   ? ?The patient has plans to undergo Mohs surgery with Dr. Danielle Dess for the mandibular skin lesion but wishes to hear about radiation therapy for his nose as the cosmetic outcome would be suboptimal with surgery ? ?He is here with his daughter.  He is in a wheelchair today.  He does need care with activities of daily living at home.  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 ?PREVIOUS RADIATION THERAPY: No ? ?PAST MEDICAL HISTORY:  has a past medical history of Chronic anticoagulation, CKD (chronic kidney disease), stage IV  ([place]), Coronary artery disease, Dyslipidemia, Elevated serum creatinine, Hypercholesterolemia, Hyperlipidemia, Hypertension, Hypotension, PAF (paroxysmal atrial fibrillation) ([place]), Spinal stenosis, and Stroke ([place]) (5-6 yrs ago).   ? ?PAST SURGICAL HISTORY: ?Past Surgical History:  ?Procedure Laterality Date  ? BACK SURGERY   5 yrs ago  ? lower  ? CATARACT EXTRACTION Bilateral yrs ago  ? CHOLECYSTECTOMY  yrs ago  ? PARTIAL PROCTECTOMY BY TEM N/A 08/05/2013  ? Procedure: rigid proctoscopy, flexible sigmoidoscopy, proctectomy by transanal endoscopic microsurgery;  Surgeon: Leighton Ruff, MD;  Location: [facility];  Service: General;  Laterality: N/A;  ? ? ?FAMILY HISTORY: family history includes Cancer in his father; Diabetes in his brother, brother, and father; Heart disease in his father and mother; Hypertension in his mother; Stroke in his mother. ? ?SOCIAL HISTORY:  reports that he quit smoking about 53 years ago. His smoking use included cigarettes. He has a 8.00 pack-year smoking history. He has been exposed to tobacco smoke. He has never used smokeless tobacco. He reports that he does not drink alcohol and does not use drugs. ? ?ALLERGIES: Patient has no known allergies. ? ?MEDICATIONS:  ?Current Outpatient Medications  ?Medication Sig Dispense Refill  ? acetaminophen (TYLENOL) 325 MG tablet Take 650 mg by mouth every 6 (six) hours as needed for mild pain or moderate pain.     ? Ascorbic Acid (VITAMIN C) 500 MG tablet Take 1 tablet (500 mg total) by mouth daily. 30 tablet   ?  Coenzyme Q10 (COQ10) 200 MG CAPS Take 1 capsule by mouth daily.     ? dabigatran (PRADAXA) 75 MG CAPS capsule Take 1 capsule (75 mg total) by mouth 2 (two) times daily. 60 capsule 11  ? furosemide (LASIX) 40 MG tablet Take 1/2 tablet daily if needed 90 tablet 3  ? gabapentin (NEURONTIN) 300 MG capsule Take 300 mg twice a day    ? hydrochlorothiazide (MICROZIDE) 12.5 MG capsule TAKE 1 CAPSULE DAILY 30 capsule 6  ? HYDROcodone-acetaminophen  (LORTAB 5) 5-500 MG per tablet Take 1 tablet by mouth every 6 (six) hours as needed for pain.    ? lisinopril (PRINIVIL,ZESTRIL) 20 MG tablet TAKE 1 TABLET BY MOUTH EVERY DAY 30 tablet 6  ? Melatonin 10 MG CAPS Take 10 mg at bedtime 30 capsule   ? pantoprazole (PROTONIX) 40 MG tablet Take 1 tablet (40 mg total) by mouth daily. 60 tablet 10  ? traZODone (DESYREL) 100 MG tablet Take 50-100 mg by mouth at bedtime.    ? WESTAB MAX 2.5-25-2 MG TABS tablet TAKE 1 TABLET BY MOUTH DAILY 30 tablet 0  ? ?No current facility-administered medications for this encounter.  ? ? ?REVIEW OF SYSTEMS:  Notable for that above. ?  ?PHYSICAL EXAM:  vitals were not taken for this visit.   ?General: Alert, in no acute distress  ?HEENT: See photo below.  Notable for raised lesion on the right side of his nose that may be invading cartilage.  No palpable tumor in the mucosa of the nostril.  He has a raised lesion of the skin at the anterior left mandible as well ?Neck: Neck is supple, no palpable facial cervical or supraclavicular lymphadenopathy. ?Skin : As above  ?Musculoskeletal: He presents in a wheelchair ?Neurologic: Resting tremor ?Psychiatric: He has a blunted affect.  He is pleasant to speak with. ? ?ECOG = 3 ? ?0 - Asymptomatic (Fully active, able to carry on all predisease activities without restriction) ? ?1 - Symptomatic but completely ambulatory (Restricted in physically strenuous activity but ambulatory and able to carry out work of a light or sedentary nature. For example, light housework, office work) ? ?2 - Symptomatic, <50% in bed during the day (Ambulatory and capable of all self care but unable to carry out any work activities. Up and about more than 50% of waking hours) ? ?3 - Symptomatic, >50% in bed, but not bedbound (Capable of only limited self-care, confined to bed or chair 50% or more of waking hours) ? ?4 - Bedbound (Completely disabled. Cannot carry on any self-care. Totally confined to bed or chair) ? ?5 -  Death ? ? Oken MM, Creech RH, Tormey DC, et al. [phone]). "Toxicity and response criteria of the [facility]". [place] Oncol. 5 (6): 649-55 ? ? ?LABORATORY DATA:  ?Lab Results  ?Component Value Date  ? WBC 8.7 08/01/2013  ? HGB 10.0 (L) 08/01/2013  ? HCT 31.3 (L) 08/01/2013  ? MCV 74.0 (L) 08/01/2013  ? PLT 291 08/01/2013  ? ?CMP  ?   ?Component Value Date/Time  ? NA 139 08/01/2013 0940  ? K 4.4 08/01/2013 0940  ? CL 106 08/01/2013 0940  ? CO2 20 08/01/2013 0940  ? GLUCOSE 121 (H) 08/01/2013 0940  ? BUN 38 (H) 08/01/2013 0940  ? CREATININE 2.22 (H) 08/01/2013 0940  ? CALCIUM 8.8 08/01/2013 0940  ? PROT 6.9 10/20/2011 1147  ? ALBUMIN 4.0 10/20/2011 1147  ? AST 18 10/20/2011 1147  ? ALT 14  10/20/2011 1147  ? ALKPHOS 48 10/20/2011 1147  ? BILITOT 1.0 10/20/2011 1147  ? GFRNONAA 28 (L) 08/01/2013 0940  ? GFRAA 32 (L) 08/01/2013 0940  ? ?   ?  ?RADIOGRAPHY: No results found. ?   ?IMPRESSION/PLAN: Today, I talked to the patient and his daughter about the findings and work-up thus far.  We discussed the patient's diagnosis of skin cancer of the nose and general treatment for this, highlighting the role of radiotherapy in the management.  We discussed the available radiation techniques, and focused on the details of logistics and delivery.   Skin cancer of the mandibular region will be treated surgically ? ?We discussed the risks, benefits, and side effects of radiotherapy. Side effects may include but not necessarily be limited to: Skin irritation, irritation of the nostril, loss of hair in the region of the nose, fatigue, rare permanent injury to the nose / cartilage .  He may have permanent skin changes once the cancer regresses and some asymmetry of the nose.  Generally the cosmetic outcome from radiation is better than after surgery with lesions as advanced as his.  The patient and his daughter understand that the optimal treatment regimen would involve coming in 5 days a week for 4 weeks.  No  guarantees of treatment were given. A consent form was signed and placed in the patient's medical record. ? ?The patient was encouraged to ask questions that I answered to the best of my ability.  He and his da

## 2021-09-27 NOTE — Progress Notes (Signed)
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 ?08/04/2021 ?   ?  ?  ?Antonio [name]. presented with the following signs/symptoms ?  ?Past/Anticipated interventions by patient's surgeon/dermatologist for current problematic lesion, if any:  ?09/09/2021 ?--Dr. Janan Ridge (office visit) ?  ?Past skin cancers, if any: ?Denies any known history skin cancer ?  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  ?SAFETY ISSUES: ?Prior radiation? No ?Pacemaker/ICD? No ?Possible current pregnancy? N/A ?Is the patient on methotrexate? No ?  ?Current Complaints / other details:  Runs a service station in [place] ?

## 2021-09-28 ENCOUNTER — Encounter: Payer: Self-pay | Admitting: Radiation Oncology

## 2021-09-28 ENCOUNTER — Ambulatory Visit
Admission: RE | Admit: 2021-09-28 | Discharge: 2021-09-28 | Disposition: A | Discharge status: Home / Self Care | Payer: Medicare Other | Source: Ambulatory Visit | Attending: Radiation Oncology | Admitting: Radiation Oncology

## 2021-09-28 ENCOUNTER — Other Ambulatory Visit: Payer: Self-pay

## 2021-09-28 DIAGNOSIS — I480 Paroxysmal atrial fibrillation: Secondary | ICD-10-CM | POA: Insufficient documentation

## 2021-09-28 DIAGNOSIS — E7800 Pure hypercholesterolemia, unspecified: Secondary | ICD-10-CM | POA: Insufficient documentation

## 2021-09-28 DIAGNOSIS — Z8673 Personal history of transient ischemic attack (TIA), and cerebral infarction without residual deficits: Secondary | ICD-10-CM | POA: Insufficient documentation

## 2021-09-28 DIAGNOSIS — Z87891 Personal history of nicotine dependence: Secondary | ICD-10-CM | POA: Insufficient documentation

## 2021-09-28 DIAGNOSIS — I129 Hypertensive chronic kidney disease with stage 1 through stage 4 chronic kidney disease, or unspecified chronic kidney disease: Secondary | ICD-10-CM | POA: Insufficient documentation

## 2021-09-28 DIAGNOSIS — Z7901 Long term (current) use of anticoagulants: Secondary | ICD-10-CM | POA: Diagnosis not present

## 2021-09-28 DIAGNOSIS — C44319 Basal cell carcinoma of skin of other parts of face: Secondary | ICD-10-CM

## 2021-09-28 DIAGNOSIS — Z79899 Other long term (current) drug therapy: Secondary | ICD-10-CM | POA: Insufficient documentation

## 2021-09-28 DIAGNOSIS — I2510 Atherosclerotic heart disease of native coronary artery without angina pectoris: Secondary | ICD-10-CM | POA: Diagnosis not present

## 2021-09-28 DIAGNOSIS — N184 Chronic kidney disease, stage 4 (severe): Secondary | ICD-10-CM | POA: Diagnosis not present

## 2021-09-28 DIAGNOSIS — C44311 Basal cell carcinoma of skin of nose: Secondary | ICD-10-CM | POA: Insufficient documentation

## 2021-09-28 NOTE — Progress Notes (Signed)
Oncology Nurse Navigator Documentation  ? ?Met with patient during initial consult with Dr. Isidore Moos.  He was accompanied by his daughter.  ?I introduced myself as his/their Navigator, explained my role as a member of the Care Team. ?Assisted with post-consult appt scheduling. ?They verbalized understanding of information provided. ?I encouraged them to call with questions/concerns moving forward. ? ?Harlow Asa, RN, BSN, OCN ?Head & Neck Oncology Nurse Navigator ?[facility] [place] ?[phone]  ?

## 2021-10-04 ENCOUNTER — Other Ambulatory Visit: Payer: Self-pay

## 2021-10-04 ENCOUNTER — Ambulatory Visit
Admission: RE | Admit: 2021-10-04 | Discharge: 2021-10-04 | Disposition: A | Discharge status: Home / Self Care | Payer: Medicare Other | Source: Ambulatory Visit | Attending: Radiation Oncology | Admitting: Radiation Oncology

## 2021-10-04 DIAGNOSIS — C44319 Basal cell carcinoma of skin of other parts of face: Secondary | ICD-10-CM | POA: Diagnosis present

## 2021-10-04 DIAGNOSIS — Z510 Encounter for antineoplastic radiation therapy: Secondary | ICD-10-CM | POA: Insufficient documentation

## 2021-10-04 NOTE — Progress Notes (Signed)
Oncology Nurse Navigator Documentation   To provide support, encouragement and care continuity, met with [name] during his CT SIM. He was accompanied by his daughter.  He tolerated procedure without difficulty, denied questions/concerns.    I encouraged him to call me prior to 10/13/21  [place].   Harlow Asa RN, BSN, OCN Head & Neck Oncology Nurse [place] at [facility] Phone # [phone]  Fax # [phone]

## 2021-10-12 DIAGNOSIS — Z510 Encounter for antineoplastic radiation therapy: Secondary | ICD-10-CM | POA: Diagnosis not present

## 2021-10-13 ENCOUNTER — Other Ambulatory Visit: Payer: Self-pay

## 2021-10-13 ENCOUNTER — Ambulatory Visit
Admission: RE | Admit: 2021-10-13 | Discharge: 2021-10-13 | Disposition: A | Discharge status: Home / Self Care | Payer: Medicare Other | Source: Ambulatory Visit | Attending: Radiation Oncology | Admitting: Radiation Oncology

## 2021-10-13 DIAGNOSIS — Z510 Encounter for antineoplastic radiation therapy: Secondary | ICD-10-CM | POA: Diagnosis not present

## 2021-10-13 LAB — RAD ONC ARIA SESSION SUMMARY
Course Elapsed Days: 0
Plan Fractions Treated to Date: 1
Plan Prescribed Dose Per Fraction: 2.5 Gy
Plan Total Fractions Prescribed: 20
Plan Total Prescribed Dose: 50 Gy
Reference Point Dosage Given to Date: 2.5 Gy
Reference Point Session Dosage Given: 2.5 Gy
Session Number: 1

## 2021-10-14 ENCOUNTER — Ambulatory Visit
Admission: RE | Admit: 2021-10-14 | Discharge: 2021-10-14 | Disposition: A | Discharge status: Home / Self Care | Payer: Medicare Other | Source: Ambulatory Visit | Attending: Radiation Oncology | Admitting: Radiation Oncology

## 2021-10-14 ENCOUNTER — Other Ambulatory Visit: Payer: Self-pay

## 2021-10-14 DIAGNOSIS — C44311 Basal cell carcinoma of skin of nose: Secondary | ICD-10-CM | POA: Insufficient documentation

## 2021-10-14 LAB — RAD ONC ARIA SESSION SUMMARY
Course Elapsed Days: 1
Plan Fractions Treated to Date: 2
Plan Prescribed Dose Per Fraction: 2.5 Gy
Plan Total Fractions Prescribed: 20
Plan Total Prescribed Dose: 50 Gy
Reference Point Dosage Given to Date: 5 Gy
Reference Point Session Dosage Given: 2.5 Gy
Session Number: 2

## 2021-10-15 ENCOUNTER — Other Ambulatory Visit: Payer: Self-pay

## 2021-10-15 ENCOUNTER — Ambulatory Visit
Admission: RE | Admit: 2021-10-15 | Discharge: 2021-10-15 | Disposition: A | Discharge status: Home / Self Care | Payer: Medicare Other | Source: Ambulatory Visit | Attending: Radiation Oncology | Admitting: Radiation Oncology

## 2021-10-15 DIAGNOSIS — C44311 Basal cell carcinoma of skin of nose: Secondary | ICD-10-CM | POA: Diagnosis not present

## 2021-10-15 LAB — RAD ONC ARIA SESSION SUMMARY
Course Elapsed Days: 2
Plan Fractions Treated to Date: 3
Plan Prescribed Dose Per Fraction: 2.5 Gy
Plan Total Fractions Prescribed: 20
Plan Total Prescribed Dose: 50 Gy
Reference Point Dosage Given to Date: 7.5 Gy
Reference Point Session Dosage Given: 2.5 Gy
Session Number: 3

## 2021-10-18 ENCOUNTER — Ambulatory Visit
Admission: RE | Admit: 2021-10-18 | Discharge: 2021-10-18 | Disposition: A | Discharge status: Home / Self Care | Payer: Medicare Other | Source: Ambulatory Visit | Attending: Radiation Oncology | Admitting: Radiation Oncology

## 2021-10-18 ENCOUNTER — Other Ambulatory Visit: Payer: Self-pay

## 2021-10-18 DIAGNOSIS — C44311 Basal cell carcinoma of skin of nose: Secondary | ICD-10-CM | POA: Diagnosis not present

## 2021-10-18 LAB — RAD ONC ARIA SESSION SUMMARY
Course Elapsed Days: 5
Plan Fractions Treated to Date: 4
Plan Prescribed Dose Per Fraction: 2.5 Gy
Plan Total Fractions Prescribed: 20
Plan Total Prescribed Dose: 50 Gy
Reference Point Dosage Given to Date: 10 Gy
Reference Point Session Dosage Given: 2.5 Gy
Session Number: 4

## 2021-10-18 MED ORDER — SONAFINE EX EMUL
1.0000 "application " | Freq: Two times a day (BID) | CUTANEOUS | Status: DC
  Administered 2021-10-18: 1 via TOPICAL

## 2021-10-18 NOTE — Progress Notes (Signed)
Pt here for patient teaching. Pt given Radiation and You booklet, Managing Acute Radiation Side Effects for Head and Neck Cancer handout, skin care instructions, and Sonafine.  Reviewed areas of pertinence such as fatigue, hair loss, mouth changes, skin changes, throat changes, headache, earaches, and taste changes. Pt able to give teach back of to pat skin, use unscented/gentle soap, and drink plenty of water, apply Sonafine bid, avoid applying anything to skin within 4 hours of treatment, and to use an electric razor if they must shave. Pt verbalizes understanding of information given and will contact nursing with any questions or concerns.     Http://rtanswers.org/treatmentinformation/whattoexpect/index

## 2021-10-19 ENCOUNTER — Other Ambulatory Visit: Payer: Self-pay

## 2021-10-19 ENCOUNTER — Ambulatory Visit
Admission: RE | Admit: 2021-10-19 | Discharge: 2021-10-19 | Disposition: A | Discharge status: Home / Self Care | Payer: Medicare Other | Source: Ambulatory Visit | Attending: Radiation Oncology | Admitting: Radiation Oncology

## 2021-10-19 DIAGNOSIS — C44311 Basal cell carcinoma of skin of nose: Secondary | ICD-10-CM | POA: Diagnosis not present

## 2021-10-19 LAB — RAD ONC ARIA SESSION SUMMARY
Course Elapsed Days: 6
Plan Fractions Treated to Date: 5
Plan Prescribed Dose Per Fraction: 2.5 Gy
Plan Total Fractions Prescribed: 20
Plan Total Prescribed Dose: 50 Gy
Reference Point Dosage Given to Date: 12.5 Gy
Reference Point Session Dosage Given: 2.5 Gy
Session Number: 5

## 2021-10-20 ENCOUNTER — Other Ambulatory Visit: Payer: Self-pay

## 2021-10-20 ENCOUNTER — Ambulatory Visit
Admission: RE | Admit: 2021-10-20 | Discharge: 2021-10-20 | Disposition: A | Discharge status: Home / Self Care | Payer: Medicare Other | Source: Ambulatory Visit | Attending: Radiation Oncology | Admitting: Radiation Oncology

## 2021-10-20 DIAGNOSIS — C44311 Basal cell carcinoma of skin of nose: Secondary | ICD-10-CM | POA: Diagnosis not present

## 2021-10-20 LAB — RAD ONC ARIA SESSION SUMMARY
Course Elapsed Days: 7
Plan Fractions Treated to Date: 6
Plan Prescribed Dose Per Fraction: 2.5 Gy
Plan Total Fractions Prescribed: 20
Plan Total Prescribed Dose: 50 Gy
Reference Point Dosage Given to Date: 15 Gy
Reference Point Session Dosage Given: 2.5 Gy
Session Number: 6

## 2021-10-21 ENCOUNTER — Other Ambulatory Visit: Payer: Self-pay

## 2021-10-21 ENCOUNTER — Ambulatory Visit
Admission: RE | Admit: 2021-10-21 | Discharge: 2021-10-21 | Disposition: A | Discharge status: Home / Self Care | Payer: Medicare Other | Source: Ambulatory Visit | Attending: Radiation Oncology | Admitting: Radiation Oncology

## 2021-10-21 DIAGNOSIS — C44311 Basal cell carcinoma of skin of nose: Secondary | ICD-10-CM | POA: Diagnosis not present

## 2021-10-21 LAB — RAD ONC ARIA SESSION SUMMARY
Course Elapsed Days: 8
Plan Fractions Treated to Date: 7
Plan Prescribed Dose Per Fraction: 2.5 Gy
Plan Total Fractions Prescribed: 20
Plan Total Prescribed Dose: 50 Gy
Reference Point Dosage Given to Date: 17.5 Gy
Reference Point Session Dosage Given: 2.5 Gy
Session Number: 7

## 2021-10-22 ENCOUNTER — Ambulatory Visit
Admission: RE | Admit: 2021-10-22 | Discharge: 2021-10-22 | Disposition: A | Discharge status: Home / Self Care | Payer: Medicare Other | Source: Ambulatory Visit | Attending: Radiation Oncology | Admitting: Radiation Oncology

## 2021-10-22 ENCOUNTER — Other Ambulatory Visit: Payer: Self-pay

## 2021-10-22 DIAGNOSIS — C44311 Basal cell carcinoma of skin of nose: Secondary | ICD-10-CM | POA: Diagnosis not present

## 2021-10-22 LAB — RAD ONC ARIA SESSION SUMMARY
Course Elapsed Days: 9
Plan Fractions Treated to Date: 8
Plan Prescribed Dose Per Fraction: 2.5 Gy
Plan Total Fractions Prescribed: 20
Plan Total Prescribed Dose: 50 Gy
Reference Point Dosage Given to Date: 20 Gy
Reference Point Session Dosage Given: 2.5 Gy
Session Number: 8

## 2021-10-25 ENCOUNTER — Other Ambulatory Visit: Payer: Self-pay

## 2021-10-25 ENCOUNTER — Ambulatory Visit
Admission: RE | Admit: 2021-10-25 | Discharge: 2021-10-25 | Disposition: A | Discharge status: Home / Self Care | Payer: Medicare Other | Source: Ambulatory Visit | Attending: Radiation Oncology | Admitting: Radiation Oncology

## 2021-10-25 DIAGNOSIS — C44311 Basal cell carcinoma of skin of nose: Secondary | ICD-10-CM | POA: Diagnosis not present

## 2021-10-25 LAB — RAD ONC ARIA SESSION SUMMARY
Course Elapsed Days: 12
Plan Fractions Treated to Date: 9
Plan Prescribed Dose Per Fraction: 2.5 Gy
Plan Total Fractions Prescribed: 20
Plan Total Prescribed Dose: 50 Gy
Reference Point Dosage Given to Date: 22.5 Gy
Reference Point Session Dosage Given: 2.5 Gy
Session Number: 9

## 2021-10-26 ENCOUNTER — Ambulatory Visit
Admission: RE | Admit: 2021-10-26 | Discharge: 2021-10-26 | Disposition: A | Discharge status: Home / Self Care | Payer: Medicare Other | Source: Ambulatory Visit | Attending: Radiation Oncology | Admitting: Radiation Oncology

## 2021-10-26 ENCOUNTER — Other Ambulatory Visit: Payer: Self-pay

## 2021-10-26 DIAGNOSIS — C44311 Basal cell carcinoma of skin of nose: Secondary | ICD-10-CM | POA: Diagnosis not present

## 2021-10-26 LAB — RAD ONC ARIA SESSION SUMMARY
Course Elapsed Days: 13
Plan Fractions Treated to Date: 10
Plan Prescribed Dose Per Fraction: 2.5 Gy
Plan Total Fractions Prescribed: 20
Plan Total Prescribed Dose: 50 Gy
Reference Point Dosage Given to Date: 25 Gy
Reference Point Session Dosage Given: 2.5 Gy
Session Number: 10

## 2021-10-27 ENCOUNTER — Ambulatory Visit
Admission: RE | Admit: 2021-10-27 | Discharge: 2021-10-27 | Disposition: A | Discharge status: Home / Self Care | Payer: Medicare Other | Source: Ambulatory Visit | Attending: Radiation Oncology | Admitting: Radiation Oncology

## 2021-10-27 ENCOUNTER — Other Ambulatory Visit: Payer: Self-pay

## 2021-10-27 DIAGNOSIS — C44311 Basal cell carcinoma of skin of nose: Secondary | ICD-10-CM | POA: Diagnosis not present

## 2021-10-27 LAB — RAD ONC ARIA SESSION SUMMARY
Course Elapsed Days: 14
Plan Fractions Treated to Date: 11
Plan Prescribed Dose Per Fraction: 2.5 Gy
Plan Total Fractions Prescribed: 20
Plan Total Prescribed Dose: 50 Gy
Reference Point Dosage Given to Date: 27.5 Gy
Reference Point Session Dosage Given: 2.5 Gy
Session Number: 11

## 2021-10-28 ENCOUNTER — Ambulatory Visit: Payer: Medicare Other

## 2021-10-29 ENCOUNTER — Ambulatory Visit: Payer: Medicare Other

## 2021-11-01 ENCOUNTER — Other Ambulatory Visit: Payer: Self-pay

## 2021-11-01 ENCOUNTER — Ambulatory Visit
Admission: RE | Admit: 2021-11-01 | Discharge: 2021-11-01 | Disposition: A | Discharge status: Home / Self Care | Payer: Medicare Other | Source: Ambulatory Visit | Attending: Radiation Oncology | Admitting: Radiation Oncology

## 2021-11-01 ENCOUNTER — Ambulatory Visit: Payer: Medicare Other

## 2021-11-01 DIAGNOSIS — C44311 Basal cell carcinoma of skin of nose: Secondary | ICD-10-CM | POA: Diagnosis not present

## 2021-11-01 LAB — RAD ONC ARIA SESSION SUMMARY
Course Elapsed Days: 19
Plan Fractions Treated to Date: 12
Plan Prescribed Dose Per Fraction: 2.5 Gy
Plan Total Fractions Prescribed: 20
Plan Total Prescribed Dose: 50 Gy
Reference Point Dosage Given to Date: 30 Gy
Reference Point Session Dosage Given: 2.5 Gy
Session Number: 12

## 2021-11-02 ENCOUNTER — Other Ambulatory Visit: Payer: Self-pay

## 2021-11-02 ENCOUNTER — Ambulatory Visit
Admission: RE | Admit: 2021-11-02 | Discharge: 2021-11-02 | Disposition: A | Discharge status: Home / Self Care | Payer: Medicare Other | Source: Ambulatory Visit | Attending: Radiation Oncology | Admitting: Radiation Oncology

## 2021-11-02 DIAGNOSIS — C44311 Basal cell carcinoma of skin of nose: Secondary | ICD-10-CM | POA: Diagnosis not present

## 2021-11-02 LAB — RAD ONC ARIA SESSION SUMMARY
Course Elapsed Days: 20
Plan Fractions Treated to Date: 13
Plan Prescribed Dose Per Fraction: 2.5 Gy
Plan Total Fractions Prescribed: 20
Plan Total Prescribed Dose: 50 Gy
Reference Point Dosage Given to Date: 32.5 Gy
Reference Point Session Dosage Given: 2.5 Gy
Session Number: 13

## 2021-11-03 ENCOUNTER — Other Ambulatory Visit: Payer: Self-pay

## 2021-11-03 ENCOUNTER — Ambulatory Visit
Admission: RE | Admit: 2021-11-03 | Discharge: 2021-11-03 | Disposition: A | Discharge status: Home / Self Care | Payer: Medicare Other | Source: Ambulatory Visit | Attending: Radiation Oncology | Admitting: Radiation Oncology

## 2021-11-03 DIAGNOSIS — C44311 Basal cell carcinoma of skin of nose: Secondary | ICD-10-CM | POA: Diagnosis not present

## 2021-11-03 LAB — RAD ONC ARIA SESSION SUMMARY
Course Elapsed Days: 21
Plan Fractions Treated to Date: 14
Plan Prescribed Dose Per Fraction: 2.5 Gy
Plan Total Fractions Prescribed: 20
Plan Total Prescribed Dose: 50 Gy
Reference Point Dosage Given to Date: 35 Gy
Reference Point Session Dosage Given: 2.5 Gy
Session Number: 14

## 2021-11-04 ENCOUNTER — Other Ambulatory Visit: Payer: Self-pay

## 2021-11-04 ENCOUNTER — Ambulatory Visit
Admission: RE | Admit: 2021-11-04 | Discharge: 2021-11-04 | Disposition: A | Discharge status: Home / Self Care | Payer: Medicare Other | Source: Ambulatory Visit | Attending: Radiation Oncology | Admitting: Radiation Oncology

## 2021-11-04 DIAGNOSIS — C44311 Basal cell carcinoma of skin of nose: Secondary | ICD-10-CM | POA: Diagnosis not present

## 2021-11-04 LAB — RAD ONC ARIA SESSION SUMMARY
Course Elapsed Days: 22
Plan Fractions Treated to Date: 15
Plan Prescribed Dose Per Fraction: 2.5 Gy
Plan Total Fractions Prescribed: 20
Plan Total Prescribed Dose: 50 Gy
Reference Point Dosage Given to Date: 37.5 Gy
Reference Point Session Dosage Given: 2.5 Gy
Session Number: 15

## 2021-11-05 ENCOUNTER — Other Ambulatory Visit: Payer: Self-pay

## 2021-11-05 ENCOUNTER — Ambulatory Visit
Admission: RE | Admit: 2021-11-05 | Discharge: 2021-11-05 | Disposition: A | Discharge status: Home / Self Care | Payer: Medicare Other | Source: Ambulatory Visit | Attending: Radiation Oncology | Admitting: Radiation Oncology

## 2021-11-05 DIAGNOSIS — C44311 Basal cell carcinoma of skin of nose: Secondary | ICD-10-CM | POA: Diagnosis not present

## 2021-11-05 LAB — RAD ONC ARIA SESSION SUMMARY
Course Elapsed Days: 23
Plan Fractions Treated to Date: 16
Plan Prescribed Dose Per Fraction: 2.5 Gy
Plan Total Fractions Prescribed: 20
Plan Total Prescribed Dose: 50 Gy
Reference Point Dosage Given to Date: 40 Gy
Reference Point Session Dosage Given: 2.5 Gy
Session Number: 16

## 2021-11-08 ENCOUNTER — Other Ambulatory Visit: Payer: Self-pay

## 2021-11-08 ENCOUNTER — Ambulatory Visit
Admission: RE | Admit: 2021-11-08 | Discharge: 2021-11-08 | Disposition: A | Discharge status: Home / Self Care | Payer: Medicare Other | Source: Ambulatory Visit | Attending: Radiation Oncology | Admitting: Radiation Oncology

## 2021-11-08 DIAGNOSIS — C44311 Basal cell carcinoma of skin of nose: Secondary | ICD-10-CM

## 2021-11-08 LAB — RAD ONC ARIA SESSION SUMMARY
Course Elapsed Days: 26
Plan Fractions Treated to Date: 17
Plan Prescribed Dose Per Fraction: 2.5 Gy
Plan Total Fractions Prescribed: 20
Plan Total Prescribed Dose: 50 Gy
Reference Point Dosage Given to Date: 42.5 Gy
Reference Point Session Dosage Given: 2.5 Gy
Session Number: 17

## 2021-11-08 MED ORDER — SONAFINE EX EMUL
1.0000 | Freq: Two times a day (BID) | CUTANEOUS | Status: DC
  Administered 2021-11-08: 1 via TOPICAL

## 2021-11-09 ENCOUNTER — Other Ambulatory Visit: Payer: Self-pay

## 2021-11-09 ENCOUNTER — Ambulatory Visit
Admission: RE | Admit: 2021-11-09 | Discharge: 2021-11-09 | Disposition: A | Discharge status: Home / Self Care | Payer: Medicare Other | Source: Ambulatory Visit | Attending: Radiation Oncology | Admitting: Radiation Oncology

## 2021-11-09 DIAGNOSIS — C44311 Basal cell carcinoma of skin of nose: Secondary | ICD-10-CM | POA: Diagnosis not present

## 2021-11-09 LAB — RAD ONC ARIA SESSION SUMMARY
Course Elapsed Days: 27
Plan Fractions Treated to Date: 18
Plan Prescribed Dose Per Fraction: 2.5 Gy
Plan Total Fractions Prescribed: 20
Plan Total Prescribed Dose: 50 Gy
Reference Point Dosage Given to Date: 45 Gy
Reference Point Session Dosage Given: 2.5 Gy
Session Number: 18

## 2021-11-10 ENCOUNTER — Ambulatory Visit: Payer: Medicare Other

## 2021-11-10 ENCOUNTER — Other Ambulatory Visit: Payer: Self-pay

## 2021-11-10 ENCOUNTER — Ambulatory Visit
Admission: RE | Admit: 2021-11-10 | Discharge: 2021-11-10 | Disposition: A | Discharge status: Home / Self Care | Payer: Medicare Other | Source: Ambulatory Visit | Attending: Radiation Oncology | Admitting: Radiation Oncology

## 2021-11-10 DIAGNOSIS — C44311 Basal cell carcinoma of skin of nose: Secondary | ICD-10-CM | POA: Diagnosis not present

## 2021-11-10 LAB — RAD ONC ARIA SESSION SUMMARY
Course Elapsed Days: 28
Plan Fractions Treated to Date: 19
Plan Prescribed Dose Per Fraction: 2.5 Gy
Plan Total Fractions Prescribed: 20
Plan Total Prescribed Dose: 50 Gy
Reference Point Dosage Given to Date: 47.5 Gy
Reference Point Session Dosage Given: 2.5 Gy
Session Number: 19

## 2021-11-11 ENCOUNTER — Ambulatory Visit
Admission: RE | Admit: 2021-11-11 | Discharge: 2021-11-11 | Disposition: A | Discharge status: Home / Self Care | Payer: Medicare Other | Source: Ambulatory Visit | Attending: Radiation Oncology | Admitting: Radiation Oncology

## 2021-11-11 ENCOUNTER — Other Ambulatory Visit: Payer: Self-pay

## 2021-11-11 ENCOUNTER — Encounter: Payer: Self-pay | Admitting: Radiation Oncology

## 2021-11-11 DIAGNOSIS — C44311 Basal cell carcinoma of skin of nose: Secondary | ICD-10-CM | POA: Diagnosis not present

## 2021-11-11 LAB — RAD ONC ARIA SESSION SUMMARY
Course Elapsed Days: 29
Plan Fractions Treated to Date: 20
Plan Prescribed Dose Per Fraction: 2.5 Gy
Plan Total Fractions Prescribed: 20
Plan Total Prescribed Dose: 50 Gy
Reference Point Dosage Given to Date: 50 Gy
Reference Point Session Dosage Given: 2.5 Gy
Session Number: 20

## 2021-12-09 NOTE — Progress Notes (Signed)
Mr. Syme presents today for follow-up after completing radiation to the right side of his nose on 11/11/2021  Fatigue: Reports symptoms have improved slightly  Pain: Denies any pain or tenderness to his nose, and denies any headaches Skin: Reports he had some oozing around scabbed area a couple of weeks ago, but has since resolved. States area is itchy but he does think scabbed area has gotten smaller Dermatology F/U: He is unsure if he as  F/U later this year or early next year Other issues of note: Reports appetite has improved. Family reports he had nose bleeds a couple of weeks ago, but they have resolved on their own. Overall, reports he's doing well and denies any new issues or concerns

## 2021-12-10 ENCOUNTER — Other Ambulatory Visit: Payer: Self-pay

## 2021-12-10 ENCOUNTER — Ambulatory Visit
Admission: RE | Admit: 2021-12-10 | Discharge: 2021-12-10 | Disposition: A | Discharge status: Home / Self Care | Payer: Medicare Other | Source: Ambulatory Visit | Attending: Radiation Oncology | Admitting: Radiation Oncology

## 2021-12-10 ENCOUNTER — Encounter: Payer: Self-pay | Admitting: Radiation Oncology

## 2021-12-10 VITALS — BP 111/71 | HR 100 | Temp 97.7°F | Resp 20

## 2021-12-10 DIAGNOSIS — Z79899 Other long term (current) drug therapy: Secondary | ICD-10-CM | POA: Insufficient documentation

## 2021-12-10 DIAGNOSIS — Z923 Personal history of irradiation: Secondary | ICD-10-CM | POA: Insufficient documentation

## 2021-12-10 DIAGNOSIS — C44311 Basal cell carcinoma of skin of nose: Secondary | ICD-10-CM | POA: Diagnosis not present

## 2021-12-10 DIAGNOSIS — Z7901 Long term (current) use of anticoagulants: Secondary | ICD-10-CM | POA: Diagnosis not present

## 2021-12-10 NOTE — Progress Notes (Signed)
Radiation Oncology         [phone]) [phone] ________________________________  Name: Antonio [name]. MRN: [id number]  Date: 12/10/2021  DOB: [birth year]  Follow-Up Visit Note  Outpatient  CC: Antonio Sacramento, MD  Venetia Night, MD  Diagnosis and Prior Radiotherapy:    ICD-10-CM   1. Basal cell carcinoma (BCC) of skin of nose  C44.311       CHIEF COMPLAINT: Here for follow-up and surveillance of nose cancer  Narrative:  The patient returns today for routine follow-up.  He is doing well.  The patient and his family are pleased with how his nose lesion has crusted over and is no longer moist or bleeding.                              ALLERGIES:  has No Known Allergies.  Meds: Current Outpatient Medications  Medication Sig Dispense Refill   acetaminophen (TYLENOL) 325 MG tablet Take 650 mg by mouth every 6 (six) hours as needed for mild pain or moderate pain.      Ascorbic Acid (VITAMIN C) 500 MG tablet Take 1 tablet (500 mg total) by mouth daily. 30 tablet    Coenzyme Q10 (COQ10) 200 MG CAPS Take 1 capsule by mouth daily.      dabigatran (PRADAXA) 75 MG CAPS capsule Take 1 capsule (75 mg total) by mouth 2 (two) times daily. 60 capsule 11   furosemide (LASIX) 40 MG tablet Take 1/2 tablet daily if needed 90 tablet 3   gabapentin (NEURONTIN) 300 MG capsule Take 300 mg twice a day     hydrochlorothiazide (MICROZIDE) 12.5 MG capsule TAKE 1 CAPSULE DAILY 30 capsule 6   HYDROcodone-acetaminophen (LORTAB 5) 5-500 MG per tablet Take 1 tablet by mouth every 6 (six) hours as needed for pain.     lisinopril (PRINIVIL,ZESTRIL) 20 MG tablet TAKE 1 TABLET BY MOUTH EVERY DAY 30 tablet 6   Melatonin 10 MG CAPS Take 10 mg at bedtime 30 capsule    pantoprazole (PROTONIX) 40 MG tablet Take 1 tablet (40 mg total) by mouth daily. 60 tablet 10   traZODone (DESYREL) 100 MG tablet Take 50-100 mg by mouth at bedtime.     WESTAB MAX 2.5-25-2 MG TABS tablet TAKE 1 TABLET BY MOUTH DAILY 30 tablet 0   No  current facility-administered medications for this encounter.    Physical Findings: The patient is in no acute distress. Patient is alert and oriented.  temperature is 97.7 F (36.5 C). His blood pressure is 111/71 and his pulse is 100. His respiration is 20 and oxygen saturation is 100%. .    The lesion on his nose is smaller and crusted without any moist discharge.  No active bleeding.    Lab Findings: Lab Results  Component Value Date   WBC 8.7 08/01/2013   HGB 10.0 (L) 08/01/2013   HCT 31.3 (L) 08/01/2013   MCV 74.0 (L) 08/01/2013   PLT 291 08/01/2013    Radiographic Findings: No results found.  Impression/Plan: He has had a good response to radiation thus far.  The lesion may fall off in the next few months.  If he does not have a complete clinical response, there may be some interventions that radiology can offer, but these may not be pursued if the risks of interventions outweigh potential benefits.   The patient's family is pleased with how the lesion has crusted over -good palliation has been  achieved thus far and a curative dose was given - complete response may be evident later this year.  We will contact dermatology so that he can resume follow-up with them in 74mo  I will see him back in 4 months, sooner if needed.  On date of service, in total, I spent 20 minutes on this encounter. Patient was seen in person.  _____________________________________   SEppie Gibson MD

## 2021-12-20 NOTE — Progress Notes (Signed)
Oncology Nurse Navigator Documentation   Per Dr. Pearlie Oyster request I have scheduled [name] with Dr. Renda Rolls of Dermatology on 02/14/22 at 1:10. I called him and he is agreeable to this date and time.   Harlow Asa RN, BSN, OCN Head & Neck Oncology Nurse [place] at [facility] Phone # [phone]  Fax # [phone]

## 2022-01-03 NOTE — Progress Notes (Signed)
  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   Plan: The patient will follow-up with radiation oncology in 33mo. -----------------------------------  SEppie Gibson MD

## 2022-04-01 NOTE — Progress Notes (Signed)
  [name] presents today for follow-up after completing radiation to the right side of his nose on 11/11/2021      Pain issues, if any: no pain Using a feeding tube?: no Weight changes, if any: none Swallowing issues, if any: no swallowing issues Smoking or chewing tobacco? none Using fluoride trays daily? none Last ENT visit was on: na Other notable issues, if any: tremors, no skin issues at present, saw Dermatology about 2 months ago  Wt Readings from Last 3 Encounters:  09/13/19 185 lb (83.9 kg)  08/24/18 185 lb (83.9 kg)  06/05/17 180 lb (81.6 kg)   Vitals:   04/12/22 1421  BP: (!) 140/80  Pulse: 65  Resp: 18  Temp: 97.8 F (36.6 C)  SpO2: 96%

## 2022-04-12 ENCOUNTER — Encounter: Payer: Self-pay | Admitting: Radiation Oncology

## 2022-04-12 ENCOUNTER — Ambulatory Visit
Admission: RE | Admit: 2022-04-12 | Discharge: 2022-04-12 | Disposition: A | Discharge status: Home / Self Care | Payer: Medicare Other | Source: Ambulatory Visit | Attending: Radiation Oncology | Admitting: Radiation Oncology

## 2022-04-12 VITALS — BP 140/80 | HR 65 | Temp 97.8°F | Resp 18 | Ht 67.0 in

## 2022-04-12 DIAGNOSIS — C44311 Basal cell carcinoma of skin of nose: Secondary | ICD-10-CM | POA: Diagnosis present

## 2022-04-12 DIAGNOSIS — Z7901 Long term (current) use of anticoagulants: Secondary | ICD-10-CM | POA: Diagnosis not present

## 2022-04-12 DIAGNOSIS — Z923 Personal history of irradiation: Secondary | ICD-10-CM | POA: Diagnosis not present

## 2022-04-12 DIAGNOSIS — Z79899 Other long term (current) drug therapy: Secondary | ICD-10-CM | POA: Diagnosis not present

## 2022-04-12 NOTE — Progress Notes (Signed)
Radiation Oncology         [phone]) [phone] ________________________________  Name: Antonio [name]. MRN: [id number]  Date: 04/12/2022  DOB: 07/02/1940  Follow-Up Visit Note  Outpatient  CC: Antonio Sacramento, MD  Venetia Night, MD  Diagnosis and Prior Radiotherapy:    ICD-10-CM   1. 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 He reports to be doing well overall. He saw dermatology 2 months ago. He states that they removed a lesion from his back that came back benign. He is pleased with how his nose has healed.  ALLERGIES:  has No Known Allergies.  Meds: Current Outpatient Medications  Medication Sig Dispense Refill   acetaminophen (TYLENOL) 325 MG tablet Take 650 mg by mouth every 6 (six) hours as needed for mild pain or moderate pain.      Ascorbic Acid (VITAMIN C) 500 MG tablet Take 1 tablet (500 mg total) by mouth daily. 30 tablet    Coenzyme Q10 (COQ10) 200 MG CAPS Take 1 capsule by mouth daily.      furosemide (LASIX) 40 MG tablet Take 1/2 tablet daily if needed 90 tablet 3   gabapentin (NEURONTIN) 300 MG capsule Take 300 mg twice a day     lisinopril (PRINIVIL,ZESTRIL) 20 MG tablet TAKE 1 TABLET BY MOUTH EVERY DAY 30 tablet 6   Melatonin 10 MG CAPS Take 10 mg at bedtime 30 capsule    rivaroxaban (XARELTO) 20 MG TABS tablet Take 20 mg by mouth daily with supper.     dabigatran (PRADAXA) 75 MG CAPS capsule Take 1 capsule (75 mg total) by mouth 2 (two) times daily. (Patient not taking: Reported on 04/12/2022) 60 capsule 11   hydrochlorothiazide (MICROZIDE) 12.5 MG capsule TAKE 1 CAPSULE DAILY (Patient not taking: Reported on 04/12/2022) 30  capsule 6   HYDROcodone-acetaminophen (LORTAB 5) 5-500 MG per tablet Take 1 tablet by mouth every 6 (six) hours as needed for pain. (Patient not taking: Reported on 04/12/2022)     pantoprazole (PROTONIX) 40 MG tablet Take 1 tablet (40 mg total) by mouth daily. (Patient not taking: Reported on 04/12/2022) 60 tablet 10   traZODone (DESYREL) 100 MG tablet Take 50-100 mg by mouth at bedtime. (Patient not taking: Reported on 04/12/2022)     WESTAB MAX 2.5-25-2 MG TABS tablet TAKE 1 TABLET BY MOUTH DAILY (Patient not taking: Reported on 04/12/2022) 30 tablet 0   No current facility-administered medications for this encounter.    Physical Findings: The patient is in no acute distress. Patient is alert and oriented.  height is '5\' 7"'$  (1.702 m). His temporal temperature is 97.8 F (36.6 C). His blood pressure is 140/80 (abnormal) and his pulse is 65. His respiration is 18 and oxygen saturation is 96%. .    The lesion on his nose is flaking slightly at the skin, but overall healing very well. No palpable adenopathy in the face or the neck.     Media Information  Document Information  Photos    04/12/2022 14:51  Attached To:  Hospital Encounter on 04/12/22 with Eppie Gibson, MD  Source Information  Marlynn Perking, PA-C  [facility]    Lab Findings: Lab Results  Component Value Date   WBC 8.7 08/01/2013   HGB 10.0 (L) 08/01/2013   HCT 31.3 (L) 08/01/2013   MCV 74.0 (L) 08/01/2013   PLT 291 08/01/2013    Radiographic Findings: No results found.  Impression/Plan: He has had a complete response to radiation with no residual disease. I am very pleased with how he has healed. No further follow-up with radiation oncology necessary. Patient knows he can call back with questions or concerns at any time.  I recommend continued follow up with dermatology at least every 6 months unless they have specific recommendations for him.  Patient was accompanied by supportive family  today.  On date of service, in total, I spent 20 minutes on this encounter. Patient was seen in person.  _____________________________________   Leona Singleton, PA    Eppie Gibson, MD

## 2022-11-23 ENCOUNTER — Telehealth: Payer: Self-pay

## 2022-11-23 NOTE — Telephone Encounter (Signed)
Received VM from [place] with Dr. Annitta Jersey office (D[facility]atology Specialist) requesting a return call to see if patient would be eligible for additional radiation for recurrent BCC to the right side of his nose.   Returned Theresa's call [phone] and left a VM letting her know that Dr. Basilio Cairo was out of the office this week, but that I would reach out to another provider for guidance. Provided direct call back number should Dr. Sharyn Lull or her staff have any additional questions/concerns.

## 2022-11-28 ENCOUNTER — Telehealth: Payer: Self-pay

## 2022-11-28 NOTE — Telephone Encounter (Signed)
Rn left message on triage line voice mail for call back. Dr. Basilio Cairo requested Dr. Sharyn Lull call her on her cell phone about this pt.  Call back details left on message. Rn awaiting a call back from office.

## 2022-11-28 NOTE — Telephone Encounter (Signed)
[name] from Dr. Annitta Jersey office called RN back. Rn Victorino Dike gave Antonio [name] information as requested by Dr. Basilio Cairo. [name] stated she would get Dr. Sharyn Lull the message.

## 2022-12-27 ENCOUNTER — Telehealth: Payer: Self-pay | Admitting: Radiation Oncology

## 2022-12-27 NOTE — Telephone Encounter (Addendum)
8/13 @ 2:25 pm Received correspondence from Dr. Annitta Jersey office with note from Dr. Basilio Cairo concerning oral therapy options.  Called/spoke to Melven Sartorius, she stated that patient's daughter Lafonda Mosses is requesting a referral place for oral therapy options for "hedghog inihibitor "  Reach out to [employer] and [facility].  Alvino Chapel will put in orders/referral as requested.  F/U called to Ms. Ashley Royalty, so they are aware.

## 2022-12-29 ENCOUNTER — Telehealth: Payer: Self-pay | Admitting: *Deleted

## 2022-12-29 ENCOUNTER — Other Ambulatory Visit: Payer: Self-pay | Admitting: Radiology

## 2022-12-29 DIAGNOSIS — C44311 Basal cell carcinoma of skin of nose: Secondary | ICD-10-CM

## 2022-12-30 ENCOUNTER — Encounter: Payer: Self-pay | Admitting: *Deleted

## 2022-12-30 NOTE — Progress Notes (Signed)
PATIENT NAVIGATOR PROGRESS NOTE  Name: Antonio [name]. Date: 12/30/2022 MRN: [id number]  DOB: June 30, 1940   Reason for visit:  New Patient appt  Comments:  Called and spoke with daughter Dewain Penning regarding New pt appt for her father. We have him scheduled with Dr Truett Perna on 8/30 at 2:05 pm  Directions to building and parking reviewed and contact information given to call with questions    Time spent counseling/coordinating care: > 60 minutes

## 2023-01-13 ENCOUNTER — Inpatient Hospital Stay: Payer: Medicare Other | Admitting: Oncology

## 2023-01-13 VITALS — BP 131/80 | HR 100 | Temp 98.1°F | Resp 18 | Ht 67.0 in

## 2023-01-13 DIAGNOSIS — C44311 Basal cell carcinoma of skin of nose: Secondary | ICD-10-CM

## 2023-01-13 NOTE — Progress Notes (Signed)
[facility] New Patient Consult   Requesting MD: Erven Colla, Pa-c [address] [place],  [place] [address]   Torey Slauson. 82 y.o.  [birth year]    Reason for Consult: Basal cell carcinoma   HPI: Mr. Chynoweth is referred for oncology evaluation of a locally recurrent basal cell carcinoma of the nose. He was diagnosed with basal cell carcinomas of the left mandibular skin and nose in March 2023.  He underwent Mohs surgery of the left mandibular lesion and was referred to Dr. Basilio Cairo for radiation of the right nose lesion.  He was treated with radiation, 50 Gray, in 20 fractions.  He had a complete clinical response to radiation.  Mr Warncke recently saw Dr. Sharyn Lull and is to have local progression of the right nose basal cell carcinoma.  Mr. Georgeson not appreciate a change at the nose.  He underwent Mohs surgery for treatment of a left temporal face squamous cell carcinoma approximately 2 weeks ago.  He is here today with his daughter and son-in-law.  He is referred to consider hedgehog pathway inhibitors and other systemic therapy options.    Past Medical History:  Diagnosis Date   Chronic anticoagulation    Replaced with Pradaxa   CKD (chronic kidney disease), stage IV (HCC)    no nephrologist   Coronary artery disease    Dyslipidemia    Elevated serum creatinine    improved with gentle hydration   Hypercholesterolemia    Hyperlipidemia    Hypertension    Hypotension    Postoperative hypotension improved with holding medication   PAF (paroxysmal atrial fibrillation) (HCC)    Spinal stenosis     L4-5   Stroke (HCC) 2009    Status post left hemisphere stroke with right-sided weakness   .Marland Kitchen  Phonic left leg pain following back surgery   .  History of pancreatitis  Past Surgical History:  Procedure Laterality Date   BACK SURGERY   5 yrs ago   lower   CATARACT EXTRACTION Bilateral yrs ago   CHOLECYSTECTOMY  yrs ago   PARTIAL PROCTECTOMY BY TEM N/A  08/05/2013   Procedure: rigid proctoscopy, flexible sigmoidoscopy, proctectomy by transanal endoscopic microsurgery;  Surgeon: Romie Levee, MD;  Location: [facility];  Service: General;  Laterality: N/A;    .  Mohs surgery at the left face for a squamous cell carcinoma in 2024 and a basal cell carcinoma 2023  Medications: Reviewed  Allergies: No Known Allergies  Family history: His father had bladder cancer.  His brother had melanoma.  A brother had prostate cancer.  Social History:   He lives with his wife in [place].  He is cared for by his daughter and son-in-law.  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  He does not use cigarettes.  He quit in his 69s.  No alcohol use.  He is not sure whether he has received a Red cell transfusion.  No risk factor for HIV or hepatitis.  ROS:   Positives include: Weight loss following the CVA in 2009, altered taste since the CVA in 2009, left arm/hand tremor, bruising over the hands and arms  A complete ROS was otherwise negative.  Physical Exam:  Blood pressure 131/80, pulse 100, temperature 98.1 F (36.7 C), temperature source Oral, resp. rate 18, height 5\' 7"  (1.702 m), SpO2 98%.  HEENT: Only a few remaining teeth, oral cavity without visible mass, neck without mass Lungs: Distant breath sounds, no respiratory  distress Cardiac: Regular rate and rhythm Abdomen: No hepatosplenomegaly  Vascular: No leg edema Lymph nodes: No cervical, supraclavicular, axillary, or inguinal nodes Neurologic: Alert and oriented, the motor exam appears intact in the upper and lower extremities bilaterally.  He is unable to ambulate.  Tremor of the left arm and hand Skin: Ecchymoses at the dorsum of the hands and lower arms, healed incision with no surrounding ecchymosis at the left face, slight erythema with a 5-6 mm slightly raised nodular area over the right side of the tip of the nose.  No skin breakdown.  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   Disposition:   Mr. Dahlen is referred for oncology evaluation due to clinical evidence of residual/progressive basal cell carcinoma of the right side of the nose.  He is asymptomatic and does not appreciate a change at the right side of his nose.  On exam today there is a slightly raised erythematous area with several 1-2 mm nodular components at the right side of the nose.  There is no clinical evidence of distant disease.  We discussed treatment options including topical therapy such as imiquimod and 5-fluorouracil.  We also discussed systemic therapy with hedgehog pathway inhibitors and immunotherapy.  Mr. Mcknight has multiple comorbid conditions and appears to have progression of the basal cell carcinoma.  I recommend observation.  He and his family are comfortable with observation approach.  He will return for an office visit in 4 months.  We will consider initiating treatment if there is a significant change at the right nose.    Thornton Papas, MD  01/13/2023, 4:04 PM

## 2023-01-17 ENCOUNTER — Encounter: Payer: Self-pay | Admitting: *Deleted

## 2023-01-17 NOTE — Progress Notes (Signed)
Consult note from Dr Kalman Drape new pt visit faxed to Dr C. Haverstock office at [phone]

## 2023-04-06 NOTE — Telephone Encounter (Signed)
Telephone call  

## 2023-05-18 ENCOUNTER — Inpatient Hospital Stay: Payer: Medicare Other | Attending: Oncology | Admitting: Oncology

## 2023-05-18 ENCOUNTER — Encounter: Payer: Self-pay | Admitting: *Deleted

## 2023-05-18 NOTE — Progress Notes (Signed)
 Patient was "no show" for appointment today. Scheduling message sent to reschedule in ~ 1 month.

## 2023-06-24 ENCOUNTER — Emergency Department (HOSPITAL_COMMUNITY): Payer: Medicare Other

## 2023-06-24 ENCOUNTER — Inpatient Hospital Stay (HOSPITAL_COMMUNITY)
Admission: EM | Admit: 2023-06-24 | Discharge: 2023-07-15 | DRG: 871 | Disposition: E | Payer: Medicare Other | Attending: Internal Medicine | Admitting: Internal Medicine

## 2023-06-24 ENCOUNTER — Observation Stay (HOSPITAL_COMMUNITY): Payer: Medicare Other

## 2023-06-24 ENCOUNTER — Other Ambulatory Visit: Payer: Self-pay

## 2023-06-24 DIAGNOSIS — G309 Alzheimer's disease, unspecified: Secondary | ICD-10-CM | POA: Diagnosis present

## 2023-06-24 DIAGNOSIS — A419 Sepsis, unspecified organism: Secondary | ICD-10-CM | POA: Diagnosis not present

## 2023-06-24 DIAGNOSIS — Z823 Family history of stroke: Secondary | ICD-10-CM

## 2023-06-24 DIAGNOSIS — J189 Pneumonia, unspecified organism: Secondary | ICD-10-CM | POA: Diagnosis not present

## 2023-06-24 DIAGNOSIS — E875 Hyperkalemia: Secondary | ICD-10-CM | POA: Diagnosis not present

## 2023-06-24 DIAGNOSIS — I480 Paroxysmal atrial fibrillation: Secondary | ICD-10-CM | POA: Diagnosis present

## 2023-06-24 DIAGNOSIS — I69351 Hemiplegia and hemiparesis following cerebral infarction affecting right dominant side: Secondary | ICD-10-CM

## 2023-06-24 DIAGNOSIS — I2510 Atherosclerotic heart disease of native coronary artery without angina pectoris: Secondary | ICD-10-CM | POA: Diagnosis present

## 2023-06-24 DIAGNOSIS — M5442 Lumbago with sciatica, left side: Secondary | ICD-10-CM | POA: Diagnosis present

## 2023-06-24 DIAGNOSIS — Z800 Family history of malignant neoplasm of digestive organs: Secondary | ICD-10-CM

## 2023-06-24 DIAGNOSIS — N1832 Chronic kidney disease, stage 3b: Secondary | ICD-10-CM | POA: Diagnosis present

## 2023-06-24 DIAGNOSIS — N400 Enlarged prostate without lower urinary tract symptoms: Secondary | ICD-10-CM | POA: Diagnosis present

## 2023-06-24 DIAGNOSIS — I69354 Hemiplegia and hemiparesis following cerebral infarction affecting left non-dominant side: Secondary | ICD-10-CM

## 2023-06-24 DIAGNOSIS — I472 Ventricular tachycardia, unspecified: Secondary | ICD-10-CM | POA: Diagnosis present

## 2023-06-24 DIAGNOSIS — G9341 Metabolic encephalopathy: Secondary | ICD-10-CM | POA: Diagnosis present

## 2023-06-24 DIAGNOSIS — Z515 Encounter for palliative care: Secondary | ICD-10-CM

## 2023-06-24 DIAGNOSIS — E8809 Other disorders of plasma-protein metabolism, not elsewhere classified: Secondary | ICD-10-CM | POA: Diagnosis present

## 2023-06-24 DIAGNOSIS — J9601 Acute respiratory failure with hypoxia: Secondary | ICD-10-CM | POA: Diagnosis present

## 2023-06-24 DIAGNOSIS — J9811 Atelectasis: Secondary | ICD-10-CM | POA: Diagnosis present

## 2023-06-24 DIAGNOSIS — Z79899 Other long term (current) drug therapy: Secondary | ICD-10-CM

## 2023-06-24 DIAGNOSIS — G629 Polyneuropathy, unspecified: Secondary | ICD-10-CM | POA: Diagnosis present

## 2023-06-24 DIAGNOSIS — F5104 Psychophysiologic insomnia: Secondary | ICD-10-CM | POA: Diagnosis present

## 2023-06-24 DIAGNOSIS — G8929 Other chronic pain: Secondary | ICD-10-CM | POA: Diagnosis present

## 2023-06-24 DIAGNOSIS — F02818 Dementia in other diseases classified elsewhere, unspecified severity, with other behavioral disturbance: Secondary | ICD-10-CM | POA: Diagnosis present

## 2023-06-24 DIAGNOSIS — I69322 Dysarthria following cerebral infarction: Secondary | ICD-10-CM

## 2023-06-24 DIAGNOSIS — Z66 Do not resuscitate: Secondary | ICD-10-CM | POA: Diagnosis present

## 2023-06-24 DIAGNOSIS — Z993 Dependence on wheelchair: Secondary | ICD-10-CM

## 2023-06-24 DIAGNOSIS — Z7901 Long term (current) use of anticoagulants: Secondary | ICD-10-CM

## 2023-06-24 DIAGNOSIS — N179 Acute kidney failure, unspecified: Secondary | ICD-10-CM | POA: Diagnosis present

## 2023-06-24 DIAGNOSIS — Z1152 Encounter for screening for COVID-19: Secondary | ICD-10-CM

## 2023-06-24 DIAGNOSIS — Z7189 Other specified counseling: Secondary | ICD-10-CM

## 2023-06-24 DIAGNOSIS — E8721 Acute metabolic acidosis: Secondary | ICD-10-CM | POA: Diagnosis present

## 2023-06-24 DIAGNOSIS — R6521 Severe sepsis with septic shock: Secondary | ICD-10-CM | POA: Diagnosis present

## 2023-06-24 DIAGNOSIS — R251 Tremor, unspecified: Secondary | ICD-10-CM | POA: Diagnosis present

## 2023-06-24 DIAGNOSIS — I130 Hypertensive heart and chronic kidney disease with heart failure and stage 1 through stage 4 chronic kidney disease, or unspecified chronic kidney disease: Secondary | ICD-10-CM | POA: Diagnosis present

## 2023-06-24 DIAGNOSIS — Z833 Family history of diabetes mellitus: Secondary | ICD-10-CM

## 2023-06-24 DIAGNOSIS — R627 Adult failure to thrive: Secondary | ICD-10-CM | POA: Diagnosis present

## 2023-06-24 DIAGNOSIS — D6489 Other specified anemias: Secondary | ICD-10-CM | POA: Diagnosis present

## 2023-06-24 DIAGNOSIS — Z8249 Family history of ischemic heart disease and other diseases of the circulatory system: Secondary | ICD-10-CM

## 2023-06-24 DIAGNOSIS — M4800 Spinal stenosis, site unspecified: Secondary | ICD-10-CM | POA: Diagnosis present

## 2023-06-24 DIAGNOSIS — Z6826 Body mass index (BMI) 26.0-26.9, adult: Secondary | ICD-10-CM

## 2023-06-24 DIAGNOSIS — Z85828 Personal history of other malignant neoplasm of skin: Secondary | ICD-10-CM

## 2023-06-24 DIAGNOSIS — J90 Pleural effusion, not elsewhere classified: Secondary | ICD-10-CM | POA: Diagnosis present

## 2023-06-24 DIAGNOSIS — K219 Gastro-esophageal reflux disease without esophagitis: Secondary | ICD-10-CM | POA: Diagnosis present

## 2023-06-24 LAB — I-STAT VENOUS BLOOD GAS, ED
Acid-base deficit: 5 mmol/L — ABNORMAL HIGH (ref 0.0–2.0)
Bicarbonate: 19.5 mmol/L — ABNORMAL LOW (ref 20.0–28.0)
Calcium, Ion: 1.11 mmol/L — ABNORMAL LOW (ref 1.15–1.40)
HCT: 24 % — ABNORMAL LOW (ref 39.0–52.0)
Hemoglobin: 8.2 g/dL — ABNORMAL LOW (ref 13.0–17.0)
O2 Saturation: 93 %
Potassium: 4.8 mmol/L (ref 3.5–5.1)
Sodium: 138 mmol/L (ref 135–145)
TCO2: 20 mmol/L — ABNORMAL LOW (ref 22–32)
pCO2, Ven: 33 mm[Hg] — ABNORMAL LOW (ref 44–60)
pH, Ven: 7.379 (ref 7.25–7.43)
pO2, Ven: 66 mm[Hg] — ABNORMAL HIGH (ref 32–45)

## 2023-06-24 LAB — URINALYSIS, W/ REFLEX TO CULTURE (INFECTION SUSPECTED)
Bacteria, UA: NONE SEEN
Bilirubin Urine: NEGATIVE
Glucose, UA: NEGATIVE mg/dL
Hgb urine dipstick: NEGATIVE
Ketones, ur: NEGATIVE mg/dL
Nitrite: NEGATIVE
Protein, ur: NEGATIVE mg/dL
Specific Gravity, Urine: 1.023 (ref 1.005–1.030)
pH: 5 (ref 5.0–8.0)

## 2023-06-24 LAB — COMPREHENSIVE METABOLIC PANEL
ALT: 11 U/L (ref 0–44)
AST: 13 U/L — ABNORMAL LOW (ref 15–41)
Albumin: 2.4 g/dL — ABNORMAL LOW (ref 3.5–5.0)
Alkaline Phosphatase: 62 U/L (ref 38–126)
Anion gap: 11 (ref 5–15)
BUN: 59 mg/dL — ABNORMAL HIGH (ref 8–23)
CO2: 19 mmol/L — ABNORMAL LOW (ref 22–32)
Calcium: 8.4 mg/dL — ABNORMAL LOW (ref 8.9–10.3)
Chloride: 107 mmol/L (ref 98–111)
Creatinine, Ser: 2.42 mg/dL — ABNORMAL HIGH (ref 0.61–1.24)
GFR, Estimated: 26 mL/min — ABNORMAL LOW (ref >60)
Glucose, Bld: 104 mg/dL — ABNORMAL HIGH (ref 70–99)
Potassium: 4.6 mmol/L (ref 3.5–5.1)
Sodium: 137 mmol/L (ref 135–145)
Total Bilirubin: 0.6 mg/dL (ref 0.0–1.2)
Total Protein: 6.8 g/dL (ref 6.5–8.1)

## 2023-06-24 LAB — PROTIME-INR
INR: 1.6 — ABNORMAL HIGH (ref 0.8–1.2)
Prothrombin Time: 19.1 s — ABNORMAL HIGH (ref 11.4–15.2)

## 2023-06-24 LAB — CBC WITH DIFFERENTIAL/PLATELET
Abs Immature Granulocytes: 0.14 10*3/uL — ABNORMAL HIGH (ref 0.00–0.07)
Basophils Absolute: 0 10*3/uL (ref 0.0–0.1)
Basophils Relative: 0 %
Eosinophils Absolute: 0.1 10*3/uL (ref 0.0–0.5)
Eosinophils Relative: 1 %
HCT: 28.9 % — ABNORMAL LOW (ref 39.0–52.0)
Hemoglobin: 8.5 g/dL — ABNORMAL LOW (ref 13.0–17.0)
Immature Granulocytes: 1 %
Lymphocytes Relative: 4 %
Lymphs Abs: 0.6 10*3/uL — ABNORMAL LOW (ref 0.7–4.0)
MCH: 24.1 pg — ABNORMAL LOW (ref 26.0–34.0)
MCHC: 29.4 g/dL — ABNORMAL LOW (ref 30.0–36.0)
MCV: 82.1 fL (ref 80.0–100.0)
Monocytes Absolute: 0.8 10*3/uL (ref 0.1–1.0)
Monocytes Relative: 6 %
Neutro Abs: 13.5 10*3/uL — ABNORMAL HIGH (ref 1.7–7.7)
Neutrophils Relative %: 88 %
Platelets: 426 10*3/uL — ABNORMAL HIGH (ref 150–400)
RBC: 3.52 MIL/uL — ABNORMAL LOW (ref 4.22–5.81)
RDW: 18.5 % — ABNORMAL HIGH (ref 11.5–15.5)
WBC: 15.2 10*3/uL — ABNORMAL HIGH (ref 4.0–10.5)
nRBC: 0 % (ref 0.0–0.2)

## 2023-06-24 LAB — CBC
HCT: 26.2 % — ABNORMAL LOW (ref 39.0–52.0)
Hemoglobin: 7.9 g/dL — ABNORMAL LOW (ref 13.0–17.0)
MCH: 24.8 pg — ABNORMAL LOW (ref 26.0–34.0)
MCHC: 30.2 g/dL (ref 30.0–36.0)
MCV: 82.4 fL (ref 80.0–100.0)
Platelets: 358 10*3/uL (ref 150–400)
RBC: 3.18 MIL/uL — ABNORMAL LOW (ref 4.22–5.81)
RDW: 18.4 % — ABNORMAL HIGH (ref 11.5–15.5)
WBC: 21 10*3/uL — ABNORMAL HIGH (ref 4.0–10.5)
nRBC: 0 % (ref 0.0–0.2)

## 2023-06-24 LAB — RESP PANEL BY RT-PCR (RSV, FLU A&B, COVID)  RVPGX2
Influenza A by PCR: NEGATIVE
Influenza B by PCR: NEGATIVE
Resp Syncytial Virus by PCR: NEGATIVE
SARS Coronavirus 2 by RT PCR: NEGATIVE

## 2023-06-24 LAB — APTT: aPTT: 56 s — ABNORMAL HIGH (ref 24–36)

## 2023-06-24 LAB — I-STAT CG4 LACTIC ACID, ED
Lactic Acid, Venous: 1.7 mmol/L (ref 0.5–1.9)
Lactic Acid, Venous: 2 mmol/L (ref 0.5–1.9)

## 2023-06-24 LAB — TROPONIN I (HIGH SENSITIVITY)
Troponin I (High Sensitivity): 6 ng/L (ref <18)
Troponin I (High Sensitivity): 8 ng/L (ref <18)

## 2023-06-24 LAB — PROCALCITONIN: Procalcitonin: 0.14 ng/mL

## 2023-06-24 LAB — BRAIN NATRIURETIC PEPTIDE: B Natriuretic Peptide: 267.3 pg/mL — ABNORMAL HIGH (ref 0.0–100.0)

## 2023-06-24 MED ORDER — ACETAMINOPHEN 500 MG PO TABS
1000.0000 mg | ORAL_TABLET | Freq: Three times a day (TID) | ORAL | Status: DC
  Administered 2023-06-24 – 2023-06-25 (×2): 1000 mg via ORAL
  Filled 2023-06-24 (×2): qty 2

## 2023-06-24 MED ORDER — LACTATED RINGERS IV BOLUS (SEPSIS)
1000.0000 mL | Freq: Once | INTRAVENOUS | Status: AC
  Administered 2023-06-24: 1000 mL via INTRAVENOUS

## 2023-06-24 MED ORDER — SODIUM CHLORIDE 0.9 % IV SOLN
2.0000 g | Freq: Once | INTRAVENOUS | Status: AC
  Administered 2023-06-24: 2 g via INTRAVENOUS
  Filled 2023-06-24: qty 12.5

## 2023-06-24 MED ORDER — HYDROMORPHONE HCL 1 MG/ML IJ SOLN
0.5000 mg | INTRAMUSCULAR | Status: DC | PRN
  Administered 2023-06-24 – 2023-06-25 (×4): 0.5 mg via INTRAVENOUS
  Filled 2023-06-24 (×4): qty 1

## 2023-06-24 MED ORDER — LACTATED RINGERS IV SOLN: INTRAVENOUS | Status: DC

## 2023-06-24 MED ORDER — VANCOMYCIN HCL IN DEXTROSE 1-5 GM/200ML-% IV SOLN
1000.0000 mg | Freq: Once | INTRAVENOUS | Status: AC
  Administered 2023-06-24: 1000 mg via INTRAVENOUS
  Filled 2023-06-24: qty 200

## 2023-06-24 MED ORDER — METRONIDAZOLE 500 MG/100ML IV SOLN
500.0000 mg | Freq: Once | INTRAVENOUS | Status: AC
  Administered 2023-06-24: 500 mg via INTRAVENOUS
  Filled 2023-06-24: qty 100

## 2023-06-24 MED ORDER — TRAZODONE HCL 50 MG PO TABS
50.0000 mg | ORAL_TABLET | Freq: Every day | ORAL | Status: DC
  Administered 2023-06-24: 50 mg via ORAL
  Filled 2023-06-24 (×2): qty 1

## 2023-06-24 MED ORDER — MORPHINE SULFATE (PF) 2 MG/ML IV SOLN
2.0000 mg | Freq: Once | INTRAVENOUS | Status: AC
  Administered 2023-06-24: 2 mg via INTRAVENOUS
  Filled 2023-06-24: qty 1

## 2023-06-24 MED ORDER — RIVAROXABAN 15 MG PO TABS
15.0000 mg | ORAL_TABLET | Freq: Every day | ORAL | Status: DC
  Administered 2023-06-24: 15 mg via ORAL
  Filled 2023-06-24: qty 1

## 2023-06-24 MED ORDER — PANTOPRAZOLE SODIUM 40 MG PO TBEC
40.0000 mg | DELAYED_RELEASE_TABLET | Freq: Every day | ORAL | Status: DC
  Administered 2023-06-24 – 2023-06-27 (×3): 40 mg via ORAL
  Filled 2023-06-24 (×3): qty 1

## 2023-06-24 MED ORDER — GABAPENTIN 300 MG PO CAPS
300.0000 mg | ORAL_CAPSULE | Freq: Two times a day (BID) | ORAL | Status: DC
  Administered 2023-06-24 – 2023-06-25 (×2): 300 mg via ORAL
  Filled 2023-06-24 (×3): qty 1

## 2023-06-24 NOTE — Treatment Plan (Signed)
 Paged for telemetry reporting episodes of recurrent, frequent NSVT and assistance in interpretation. Patient admitted for suspected sepsis with hx if Afib and resting tremor. Telemetry reviewed in ED that is most consistent with artifact and PVCs. Currently no evidence of prolonged or recurrent episodes of NSVT.

## 2023-06-24 NOTE — ED Triage Notes (Addendum)
 Patient presents via EMS from home with generalized chest pressure radiating to bilateral shoulders and back. Per EMS, patient has dysarthria at baseline. The patient states that the chest pain started this morning while he was watching TV. Patient received approximately 200mL IVF bolus, 0.4mg  sublingual nitroglycerin without change, and 324mg  aspirin en route from EMS.

## 2023-06-24 NOTE — ED Notes (Signed)
 ED TO INPATIENT HANDOFF REPORT  ED Nurse Name and Phone #:  Hulan, [phone]  S Name/Age/Gender Antonio [name]. 83 y.o. male Room/Bed: 004C/004C  Code Status   Code Status: Limited: Do not attempt resuscitation (DNR) -DNR-LIMITED -Do Not Intubate/DNI   Home/SNF/Other Home Patient oriented to: self, place, and situation Is this baseline? Yes   Triage Complete: Triage complete  Chief Complaint Acute hypoxic respiratory failure (HCC) [J96.01]  Triage Note Patient presents via EMS from home with generalized chest pressure radiating to bilateral shoulders and back. Per EMS, patient has dysarthria at baseline. The patient states that the chest pain started this morning while he was watching TV. Patient received approximately 200mL IVF bolus, 0.4mg  sublingual nitroglycerin without change, and 324mg  aspirin en route from EMS.    Allergies No Known Allergies  Level of Care/Admitting Diagnosis ED Disposition     ED Disposition  Admit   Condition  --   Comment  Hospital Area: [facility] [[id number]]  Level of Care: Telemetry Medical [[id number]]  May place patient in observation at [facility] or [facility] if equivalent level of care is available:: Yes  Covid Evaluation: Confirmed COVID Negative  Diagnosis: Acute hypoxic respiratory failure [facility]) [[id number]]  Admitting Physician: ROSAN DAYTON JAYSON [[id number]]  Attending Physician: ROSAN DAYTON JAYSON [[id number]]          B Medical/Surgery History Past Medical History:  Diagnosis Date   Chronic anticoagulation    Replaced with Pradaxa    CKD (chronic kidney disease), stage IV (HCC)    no nephrologist   Coronary artery disease    Dyslipidemia    Elevated serum creatinine    improved with gentle hydration   Hypercholesterolemia    Hyperlipidemia    Hypertension    Hypotension    Postoperative hypotension improved with holding medication   PAF (paroxysmal atrial fibrillation) (HCC)    Spinal stenosis     L4-5   Stroke  (HCC) 5-6 yrs ago    Status post left hemisphere stroke with right-sided weakness   Past Surgical History:  Procedure Laterality Date   BACK SURGERY   5 yrs ago   lower   CATARACT EXTRACTION Bilateral yrs ago   CHOLECYSTECTOMY  yrs ago   PARTIAL PROCTECTOMY BY TEM N/A 08/05/2013   Procedure: rigid proctoscopy, flexible sigmoidoscopy, proctectomy by transanal endoscopic microsurgery;  Surgeon: Bernarda Ned, MD;  Location: [facility];  Service: General;  Laterality: N/A;     A IV Location/Drains/Wounds Patient Lines/Drains/Airways Status     Active Line/Drains/Airways     Name Placement date Placement time Site Days   Peripheral IV 06/24/23 18 G Distal;Left;Posterior Forearm 06/24/23  --  Forearm  less than 1   Peripheral IV 06/24/23 20 G 1 Posterior;Right Forearm 06/24/23  1906  Forearm  less than 1   Incision (Closed) 08/05/13 Perineum 08/05/13  1432  -- 3610            Intake/Output Last 24 hours  Intake/Output Summary (Last 24 hours) at 06/24/2023 2210 Last data filed at 06/24/2023 2102 Gross per 24 hour  Intake 1599 ml  Output --  Net 1599 ml    Labs/Imaging Results for orders placed or performed during the hospital encounter of 06/24/23 (from the past 48 hours)  Troponin I (High Sensitivity)     Status: None   Collection Time: 06/24/23  4:36 PM  Result Value Ref Range   Troponin I (High Sensitivity) 6 <18 ng/L    Comment: (NOTE) Elevated  high sensitivity troponin I (hsTnI) values and significant  changes across serial measurements may suggest ACS but many other  chronic and acute conditions are known to elevate hsTnI results.  Refer to the Links section for chest pain algorithms and additional  guidance. Performed at [facility], [address], [place], [place] [address]   CBC with Differential     Status: Abnormal   Collection Time: 06/24/23  4:36 PM  Result Value Ref Range   WBC 15.2 (H) 4.0 - 10.5 K/uL   RBC 3.52 (L) 4.22 - 5.81 MIL/uL    Hemoglobin 8.5 (L) 13.0 - 17.0 g/dL   HCT 71.0 (L) 60.9 - 47.9 %   MCV 82.1 80.0 - 100.0 fL   MCH 24.1 (L) 26.0 - 34.0 pg   MCHC 29.4 (L) 30.0 - 36.0 g/dL   RDW 81.4 (H) 88.4 - 84.4 %   Platelets 426 (H) 150 - 400 K/uL   nRBC 0.0 0.0 - 0.2 %   Neutrophils Relative % 88 %   Neutro Abs 13.5 (H) 1.7 - 7.7 K/uL   Lymphocytes Relative 4 %   Lymphs Abs 0.6 (L) 0.7 - 4.0 K/uL   Monocytes Relative 6 %   Monocytes Absolute 0.8 0.1 - 1.0 K/uL   Eosinophils Relative 1 %   Eosinophils Absolute 0.1 0.0 - 0.5 K/uL   Basophils Relative 0 %   Basophils Absolute 0.0 0.0 - 0.1 K/uL   Immature Granulocytes 1 %   Abs Immature Granulocytes 0.14 (H) 0.00 - 0.07 K/uL    Comment: Performed at [facility], [address] [place], [place] [address]  Resp panel by RT-PCR (RSV, Flu A&B, Covid) Anterior Nasal Swab     Status: None   Collection Time: 06/24/23  4:37 PM   Specimen: Anterior Nasal Swab  Result Value Ref Range   SARS Coronavirus 2 by RT PCR NEGATIVE NEGATIVE   Influenza A by PCR NEGATIVE NEGATIVE   Influenza B by PCR NEGATIVE NEGATIVE    Comment: (NOTE) The Xpert Xpress SARS-CoV-2/FLU/RSV plus assay is intended as an aid in the diagnosis of influenza from Nasopharyngeal swab specimens and should not be used as a sole basis for treatment. Nasal washings and aspirates are unacceptable for Xpert Xpress SARS-CoV-2/FLU/RSV testing.  Fact Sheet for Patients: bloggercourse.com  Fact Sheet for Healthcare Providers: seriousbroker.it  This test is not yet approved or cleared by the [place]  FDA and has been authorized for detection and/or diagnosis of SARS-CoV-2 by FDA under an Emergency Use Authorization (EUA). This EUA will remain in effect (meaning this test can be used) for the duration of the COVID-19 declaration under Section 564(b)(1) of the Act, 21 U.S.C. section 360bbb-3(b)(1), unless the authorization is terminated  or revoked.     Resp Syncytial Virus by PCR NEGATIVE NEGATIVE    Comment: (NOTE) Fact Sheet for Patients: bloggercourse.com  Fact Sheet for Healthcare Providers: seriousbroker.it  This test is not yet approved or cleared by the [place]  FDA and has been authorized for detection and/or diagnosis of SARS-CoV-2 by FDA under an Emergency Use Authorization (EUA). This EUA will remain in effect (meaning this test can be used) for the duration of the COVID-19 declaration under Section 564(b)(1) of the Act, 21 U.S.C. section 360bbb-3(b)(1), unless the authorization is terminated or revoked.  Performed at [facility], [address] [place], [place] [address]   Comprehensive metabolic panel     Status: Abnormal   Collection Time: 06/24/23  4:38 PM  Result Value Ref Range   Sodium 137 135 - 145 mmol/L   Potassium 4.6 3.5 - 5.1 mmol/L   Chloride 107 98 - 111 mmol/L   CO2 19 (L) 22 - 32 mmol/L   Glucose, Bld 104 (H) 70 - 99 mg/dL    Comment: Glucose reference range applies only to samples taken after fasting for at least 8 hours.   BUN 59 (H) 8 - 23 mg/dL   Creatinine, Ser 7.57 (H) 0.61 - 1.24 mg/dL   Calcium  8.4 (L) 8.9 - 10.3 mg/dL   Total Protein 6.8 6.5 - 8.1 g/dL   Albumin 2.4 (L) 3.5 - 5.0 g/dL   AST 13 (L) 15 - 41 U/L   ALT 11 0 - 44 U/L   Alkaline Phosphatase 62 38 - 126 U/L   Total Bilirubin 0.6 0.0 - 1.2 mg/dL   GFR, Estimated 26 (L) >60 mL/min    Comment: (NOTE) Calculated using the CKD-EPI Creatinine Equation (2021)    Anion gap 11 5 - 15    Comment: Performed at [facility], [address], [place], [place] [address]  Protime-INR     Status: Abnormal   Collection Time: 06/24/23  4:38 PM  Result Value Ref Range   Prothrombin Time 19.1 (H) 11.4 - 15.2 seconds   INR 1.6 (H) 0.8 - 1.2    Comment: (NOTE) INR goal varies based on device and disease states. Performed at [facility], [address] [place], [place] [address]   APTT     Status: Abnormal   Collection Time: 06/24/23  4:38 PM  Result Value Ref Range   aPTT 56 (H) 24 - 36 seconds    Comment:        IF BASELINE aPTT IS ELEVATED, SUGGEST PATIENT RISK ASSESSMENT BE USED TO DETERMINE APPROPRIATE ANTICOAGULANT THERAPY. Performed at [facility], [address] [place], [place] [address]   I-Stat Lactic Acid, ED     Status: None   Collection Time: 06/24/23  5:17 PM  Result Value Ref Range   Lactic Acid, Venous 1.7 0.5 - 1.9 mmol/L  Urinalysis, w/ Reflex to Culture (Infection Suspected) -Urine, Clean Catch     Status: Abnormal   Collection Time: 06/24/23  7:06 PM  Result Value Ref Range   Specimen Source URINE, CLEAN CATCH    Color, Urine YELLOW YELLOW   APPearance CLEAR CLEAR   Specific Gravity, Urine 1.023 1.005 - 1.030   pH 5.0 5.0 - 8.0   Glucose, UA NEGATIVE NEGATIVE mg/dL   Hgb urine dipstick NEGATIVE NEGATIVE   Bilirubin Urine NEGATIVE NEGATIVE   Ketones, ur NEGATIVE NEGATIVE mg/dL   Protein, ur NEGATIVE NEGATIVE mg/dL   Nitrite NEGATIVE NEGATIVE   Leukocytes,Ua SMALL (A) NEGATIVE   RBC / HPF 0-5 0 - 5 RBC/hpf   WBC, UA 6-10 0 - 5 WBC/hpf    Comment:        Reflex urine culture not performed if WBC <=10, OR if Squamous epithelial cells >5. If Squamous epithelial cells >5 suggest recollection.    Bacteria, UA NONE SEEN NONE SEEN   Squamous Epithelial / HPF 0-5 0 - 5 /HPF   Mucus PRESENT     Comment: Performed at [facility], [address], [place], [place] [address]  Troponin I (High Sensitivity)     Status: None   Collection Time: 06/24/23  7:06 PM  Result Value Ref Range   Troponin I (  High Sensitivity) 8 <18 ng/L    Comment: (NOTE) Elevated high sensitivity troponin I (hsTnI) values and significant  changes across serial measurements may suggest ACS but many other  chronic and acute conditions are known to elevate hsTnI results.  Refer to the Links section for chest pain  algorithms and additional  guidance. Performed at [facility], [address], [place], [place] [address]   Procalcitonin     Status: None   Collection Time: 06/24/23  7:06 PM  Result Value Ref Range   Procalcitonin 0.14 ng/mL    Comment:        Interpretation: PCT (Procalcitonin) <= 0.5 ng/mL: Systemic infection (sepsis) is not likely. Local bacterial infection is possible. (NOTE)       Sepsis PCT Algorithm           Lower Respiratory Tract                                      Infection PCT Algorithm    ----------------------------     ----------------------------         PCT < 0.25 ng/mL                PCT < 0.10 ng/mL          Strongly encourage             Strongly discourage   discontinuation of antibiotics    initiation of antibiotics    ----------------------------     -----------------------------       PCT 0.25 - 0.50 ng/mL            PCT 0.10 - 0.25 ng/mL               OR       >80% decrease in PCT            Discourage initiation of                                            antibiotics      Encourage discontinuation           of antibiotics    ----------------------------     -----------------------------         PCT >= 0.50 ng/mL              PCT 0.26 - 0.50 ng/mL               AND        <80% decrease in PCT             Encourage initiation of                                             antibiotics       Encourage continuation           of antibiotics    ----------------------------     -----------------------------        PCT >= 0.50 ng/mL                  PCT > 0.50 ng/mL  AND         increase in PCT                  Strongly encourage                                      initiation of antibiotics    Strongly encourage escalation           of antibiotics                                     -----------------------------                                           PCT <= 0.25 ng/mL                                                 OR                                         > 80% decrease in PCT                                      Discontinue / Do not initiate                                             antibiotics  Performed at [facility], [address] [place], [place] [address]   I-Stat Lactic Acid, ED     Status: Abnormal   Collection Time: 06/24/23  9:26 PM  Result Value Ref Range   Lactic Acid, Venous 2.0 (HH) 0.5 - 1.9 mmol/L   Comment NOTIFIED PHYSICIAN    CT Head Wo Contrast Result Date: 06/24/2023 CLINICAL DATA:  Headache, new onset (Age >= 51y) EXAM: CT HEAD WITHOUT CONTRAST TECHNIQUE: Contiguous axial images were obtained from the base of the skull through the vertex without intravenous contrast. RADIATION DOSE REDUCTION: This exam was performed according to the departmental dose-optimization program which includes automated exposure control, adjustment of the mA and/or kV according to patient size and/or use of iterative reconstruction technique. COMPARISON:  12/10/2007 FINDINGS: Brain: There is atrophy and chronic small vessel disease changes. No acute intracranial abnormality. Specifically, no hemorrhage, hydrocephalus, mass lesion, acute infarction, or significant intracranial injury. Vascular: No hyperdense vessel or unexpected calcification. Skull: No acute calvarial abnormality. Sinuses/Orbits: No acute findings Other: None IMPRESSION: Atrophy, chronic microvascular disease. No acute intracranial abnormality. Electronically Signed   By: Franky Crease M.D.   On: 06/24/2023 18:08   DG Chest 1 View Result Date: 06/24/2023 CLINICAL DATA:  Chest pain EXAM: CHEST  1 VIEW COMPARISON:  08/01/2013 FINDINGS: Large right pleural effusion. Bibasilar opacities, right greater than left could reflect atelectasis or infiltrates. Cardiomegaly, vascular congestion. No acute bony abnormality.  IMPRESSION: Large right pleural effusion.  Bibasilar atelectasis or infiltrates. Cardiomegaly, vascular congestion. Electronically Signed    By: Franky Crease M.D.   On: 06/24/2023 17:31    Pending Labs Unresulted Labs (From admission, onward)     Start     Ordered   06/24/23 2157  Blood gas, venous  Once,   R        06/24/23 2156   06/24/23 2156  CBC  Once,   R        06/24/23 2155   06/24/23 2147  Lipase, blood  Add-on,   AD        06/24/23 2146   06/24/23 1638  Blood Culture (routine x 2)  (Undifferentiated presentation (screening labs and basic nursing orders))  BLOOD CULTURE X 2,   STAT      06/24/23 1638            Vitals/Pain Today's Vitals   06/24/23 1915 06/24/23 1930 06/24/23 2000 06/24/23 2045  BP: 98/65 91/61 (!) 93/56 91/61  Pulse:  (!) 112 (!) 117 (!) 117  Resp: (!) 28 (!) 30 17 (!) 22  Temp:   98.6 F (37 C)   TempSrc:   Oral   SpO2: 97% 98% 97% 95%  PainSc:        Isolation Precautions No active isolations  Medications Medications  lactated ringers  infusion ( Intravenous New Bag/Given 06/24/23 2120)  gabapentin  (NEURONTIN ) capsule 300 mg (300 mg Oral Given 06/24/23 2136)  pantoprazole  (PROTONIX ) EC tablet 40 mg (40 mg Oral Given 06/24/23 2136)  traZODone  (DESYREL ) tablet 50 mg (50 mg Oral Given 06/24/23 2136)  acetaminophen  (TYLENOL ) tablet 1,000 mg (1,000 mg Oral Given 06/24/23 2136)  Rivaroxaban  (XARELTO ) tablet 15 mg (15 mg Oral Given 06/24/23 2136)  morphine  (PF) 2 MG/ML injection 2 mg (2 mg Intravenous Given 06/24/23 1852)  ceFEPIme  (MAXIPIME ) 2 g in sodium chloride  0.9 % 100 mL IVPB (0 g Intravenous Stopped 06/24/23 1937)  metroNIDAZOLE  (FLAGYL ) IVPB 500 mg (0 mg Intravenous Stopped 06/24/23 2040)  vancomycin  (VANCOCIN ) IVPB 1000 mg/200 mL premix (0 mg Intravenous Stopped 06/24/23 2102)  lactated ringers  bolus 1,000 mL (0 mLs Intravenous Stopped 06/24/23 1948)  lactated ringers  bolus 1,000 mL (0 mLs Intravenous Stopped 06/24/23 2126)    Mobility non-ambulatory     Focused Assessments Cardiac Assessment Handoff:    Lab Results  Component Value Date   CKTOTAL 66 10/20/2011   CKMB 4.1 (H)  12/10/2007   TROPONINI 0.01        NO INDICATION OF MYOCARDIAL INJURY. 12/10/2007   No results found for: DDIMER Does the Patient currently have chest pain? No    R Recommendations: See Admitting Provider Note  Report given to:   Additional Notes: 3 L [place], new O2 requirement

## 2023-06-24 NOTE — ED Provider Notes (Signed)
 [facility]ERGENCY DEPARTMENT AT [facility] Provider Note   CSN: [id number] Arrival date & time: 06/24/23  1614     History  Chief Complaint  Patient presents with   Chest Pain    Antonio [name]. is a 83 y.o. male Hx Afib, stroke, L sided weakness, dysarthria presents today for back pain, CP, HA since this morning.  Patient endorses congestion.  Patient and patient's daughter deny any recent falls, fever, chills, nausea, vomiting, or abdominal pain.  Patient has had decreased appetite for approximately 1 year now.  Patient is typically not on oxygen at home but is requiring 2 L nasal cannula while in ED.   Chest Pain Associated symptoms: back pain and headache        Home Medications Prior to Admission medications   Medication Sig Start Date End Date Taking? Authorizing Provider  acetaminophen  (TYLENOL ) 325 MG tablet Take 650 mg by mouth every 6 (six) hours as needed for mild pain or moderate pain.     [provider]  Ascorbic Acid (VITAMIN C) 500 MG tablet Take 1 tablet (500 mg total) by mouth daily. 08/19/10   Jordan, Peter M, MD  Coenzyme Q10 (COQ10) 200 MG CAPS Take 1 capsule by mouth daily.  08/19/10   Jordan, Peter M, MD  furosemide (LASIX) 40 MG tablet Take 1/2 tablet daily if needed 09/13/19   Jordan, Peter M, MD  gabapentin  (NEURONTIN ) 300 MG capsule Take 300 mg twice a day 09/13/19   Jordan, Peter M, MD  hydrochlorothiazide  (MICROZIDE ) 12.5 MG capsule TAKE 1 CAPSULE DAILY 01/16/18   Jordan, Peter M, MD  lisinopril  (PRINIVIL ,ZESTRIL ) 20 MG tablet TAKE 1 TABLET BY MOUTH EVERY DAY 04/04/17   Jordan, Peter M, MD  Melatonin 10 MG CAPS Take 10 mg at bedtime 09/13/19   Jordan, Peter M, MD  pantoprazole  (PROTONIX ) 40 MG tablet Take 1 tablet (40 mg total) by mouth daily. 10/22/18   Jordan, Peter M, MD  rivaroxaban  (XARELTO ) 20 MG TABS tablet Take 20 mg by mouth daily with supper.    [provider]  traZODone  (DESYREL ) 100 MG tablet Take 50-100 mg by mouth  at bedtime. 08/01/21   [provider]  WESTAB MAX 2.5-25-2 MG TABS tablet TAKE 1 TABLET BY MOUTH DAILY 05/03/21   Jordan, Peter M, MD      Allergies    Patient has no known allergies.    Review of Systems   Review of Systems  Cardiovascular:  Positive for chest pain.  Musculoskeletal:  Positive for back pain.  Neurological:  Positive for headaches.    Physical Exam Updated Vital Signs BP 91/61   Pulse (!) 112   Temp 98.7 F (37.1 C) (Oral)   Resp (!) 30   SpO2 98%  Physical Exam Vitals and nursing note reviewed.  Constitutional:      General: He is not in acute distress.    Appearance: He is well-developed.  HENT:     Head: Normocephalic and atraumatic.  Eyes:     Extraocular Movements: Extraocular movements intact.     Conjunctiva/sclera: Conjunctivae normal.  Cardiovascular:     Rate and Rhythm: Tachycardia present. Rhythm irregular.     Heart sounds: Normal heart sounds. No murmur heard. Pulmonary:     Effort: Pulmonary effort is normal. Tachypnea present. No respiratory distress.     Breath sounds: Normal breath sounds.  Abdominal:     Palpations: Abdomen is soft.     Tenderness: There is  no abdominal tenderness.  Musculoskeletal:        General: No swelling.     Cervical back: Neck supple.     Right lower leg: No tenderness.     Left lower leg: No tenderness.  Skin:    General: Skin is warm and dry.     Capillary Refill: Capillary refill takes less than 2 seconds.  Neurological:     Mental Status: He is alert. Mental status is at baseline.     Motor: Weakness and tremor present.  Psychiatric:        Mood and Affect: Mood normal.     ED Results / Procedures / Treatments   Labs (all labs ordered are listed, but only abnormal results are displayed) Labs Reviewed  CBC WITH DIFFERENTIAL/PLATELET - Abnormal; Notable for the following components:      Result Value   WBC 15.2 (*)    RBC 3.52 (*)    Hemoglobin 8.5 (*)    HCT 28.9 (*)    MCH 24.1  (*)    MCHC 29.4 (*)    RDW 18.5 (*)    Platelets 426 (*)    Neutro Abs 13.5 (*)    Lymphs Abs 0.6 (*)    Abs Immature Granulocytes 0.14 (*)    All other components within normal limits  URINALYSIS, W/ REFLEX TO CULTURE (INFECTION SUSPECTED) - Abnormal; Notable for the following components:   Leukocytes,Ua SMALL (*)    All other components within normal limits  COMPREHENSIVE METABOLIC PANEL - Abnormal; Notable for the following components:   CO2 19 (*)    Glucose, Bld 104 (*)    BUN 59 (*)    Creatinine, Ser 2.42 (*)    Calcium  8.4 (*)    Albumin 2.4 (*)    AST 13 (*)    GFR, Estimated 26 (*)    All other components within normal limits  PROTIME-INR - Abnormal; Notable for the following components:   Prothrombin Time 19.1 (*)    INR 1.6 (*)    All other components within normal limits  APTT - Abnormal; Notable for the following components:   aPTT 56 (*)    All other components within normal limits  RESP PANEL BY RT-PCR (RSV, FLU A&B, COVID)  RVPGX2  CULTURE, BLOOD (ROUTINE X 2)  CULTURE, BLOOD (ROUTINE X 2)  I-STAT CG4 LACTIC ACID, ED  TROPONIN I (HIGH SENSITIVITY)  TROPONIN I (HIGH SENSITIVITY)    EKG None  Radiology CT Head Wo Contrast Result Date: 06/24/2023 CLINICAL DATA:  Headache, new onset (Age >= 51y) EXAM: CT HEAD WITHOUT CONTRAST TECHNIQUE: Contiguous axial images were obtained from the base of the skull through the vertex without intravenous contrast. RADIATION DOSE REDUCTION: This exam was performed according to the departmental dose-optimization program which includes automated exposure control, adjustment of the mA and/or kV according to patient size and/or use of iterative reconstruction technique. COMPARISON:  12/10/2007 FINDINGS: Brain: There is atrophy and chronic small vessel disease changes. No acute intracranial abnormality. Specifically, no hemorrhage, hydrocephalus, mass lesion, acute infarction, or significant intracranial injury. Vascular: No  hyperdense vessel or unexpected calcification. Skull: No acute calvarial abnormality. Sinuses/Orbits: No acute findings Other: None IMPRESSION: Atrophy, chronic microvascular disease. No acute intracranial abnormality. Electronically Signed   By: Franky Crease M.D.   On: 06/24/2023 18:08   DG Chest 1 View Result Date: 06/24/2023 CLINICAL DATA:  Chest pain EXAM: CHEST  1 VIEW COMPARISON:  08/01/2013 FINDINGS: Large right pleural effusion. Bibasilar opacities, right  greater than left could reflect atelectasis or infiltrates. Cardiomegaly, vascular congestion. No acute bony abnormality. IMPRESSION: Large right pleural effusion.  Bibasilar atelectasis or infiltrates. Cardiomegaly, vascular congestion. Electronically Signed   By: Franky Crease M.D.   On: 06/24/2023 17:31    Procedures Procedures    Medications Ordered in ED Medications  lactated ringers  infusion (has no administration in time range)  metroNIDAZOLE  (FLAGYL ) IVPB 500 mg (500 mg Intravenous New Bag/Given 06/24/23 1940)  vancomycin  (VANCOCIN ) IVPB 1000 mg/200 mL premix (1,000 mg Intravenous New Bag/Given 06/24/23 2002)  lactated ringers  bolus 1,000 mL (has no administration in time range)  morphine  (PF) 2 MG/ML injection 2 mg (2 mg Intravenous Given 06/24/23 1852)  ceFEPIme  (MAXIPIME ) 2 g in sodium chloride  0.9 % 100 mL IVPB (0 g Intravenous Stopped 06/24/23 1937)  lactated ringers  bolus 1,000 mL (0 mLs Intravenous Stopped 06/24/23 1948)    ED Course/ Medical Decision Making/ A&P                                 Medical Decision Making Amount and/or Complexity of Data Reviewed Labs: ordered. Radiology: ordered.   This patient presents to the ED with chief complaint(s) of back, chest, head pain with pertinent past medical history of stroke, dysarthria, A-fib, left-sided weakness which further complicates the presenting complaint. The complaint involves an extensive differential diagnosis and also carries with it a high risk of complications  and morbidity.    The differential diagnosis includes ACS, stroke, brain bleed, pneumonia, flu, RSV, COVID  Additional history obtained: Additional history obtained from family Records reviewed Care Everywhere/External Records  ED Course and Reassessment: Sepsis protocol started Patient given 2 L LR bolus  Independent labs interpretation:  The following labs were independently interpreted:  CBC: Leukocytosis at 15.2, anemia 18.5 CMP: Mildly decreased CO2, elevated bun at 59, elevated creatinine at 2.42, decreased calcium , decreased albumin, decreased AST Troponin: 6, 8 EKG: Sinus tachycardia, multiple PVCs Respiratory panel: Negative Lactic acid: 1.7 Pro time-INR: 19.1 and 1.6 APTT: 56 Blood cultures: Pending UA: Small leukocytes, 6-10 WBCs  Independent visualization of imaging: - I independently visualized the following imaging with scope of interpretation limited to determining acute life threatening conditions related to emergency care: CT head Noncon: No acute intracranial abnormality Chest x-ray: Large right pleural effusion.  Bibasilar atelectasis or infiltrates.  Cardiomegaly, vascular congestion  Consultation: - Consulted or discussed management/test interpretation w/ external professional: Hospitalist, Dr. Jolaine who is agreeable to admission for pneumonia  Consideration for admission or further workup: Patient admitted for pneumonia.        Final Clinical Impression(s) / ED Diagnoses Final diagnoses:  Pneumonia due to infectious organism, unspecified laterality, unspecified part of lung    Rx / DC Orders ED Discharge Orders     None         Francis Ileana SAILOR, PA-C 06/24/23 2009    Neysa Caron PARAS, [place] 06/24/23 2121

## 2023-06-24 NOTE — H&P (Addendum)
 Date: 06/25/2023               Patient Name:  Antonio [name]. MRN: [id number]  DOB: 14-Oct-1940 Age / Sex: 83 y.o., male   PCP: Tanda Prentice DEL, MD         Medical Service: Internal Medicine Teaching Service         Attending Physician: Dr. Rosan Dayton JAYSON, DO      First Contact: Dr. Norman Lobstein, DO Pager [phone]    Second Contact: Dr. 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  His daughter is bedside and contributed to the history.  Patient and daughter both state that he had a burning chest pain this morning which radiated across his chest into his back. He has not had pain like this before, so his daughter brought him to the emergency department. This chest pain has now resolved. He did not feel lightheaded or dizzy. He did not have any confusion. He has had no recent injuries. He denies any recent illnesses or infectious symptoms including a cough, congestion, fevers/chills, nausea or vomiting, diarrhea. He is on daily anticoagulation for his atrial fibrillation.  He denies shortness of breath but does have a new oxygen requirement.   His does note severe lower back pain, which seems to be chronic. He had prior lumbar stenosis s/p several spinal surgeries. He also has chronic sciatic pain down his left leg.  He feels like the back pain has been worsening recently.  ED Course:  Sepsis protocol started. Started on IV metronidazole , vancomycin , cefepime .  Given 2 L LR.   CBC with leukocytosis at 15.2. Hgb 8.5.  CMP with elevated creatinine at 2.42, BUN 59, hypoalbuminemia.  Troponin 6, 8.  EKG showed sinus tachycardia.  Respiratory viral panel was negative.  Lactic acid was 1.7.  Urinalysis was unremarkable.  Blood cultures are pending. Chest x-ray showed large right pleural effusion with bibasilar atelectasis/infiltrates.   Meds:  Lisinopril  20 mg daily Hydrochlorothiazide  12.5 mg twice daily, recently discontinued Lasix 40 mg twice daily, not using regularly Trazodone  100 mg nightly Melatonin 10 mg nightly Gabapentin  300 mg twice daily for nerve pain Xarelto  20 mg daily Protonix  40 mg daily  Past Medical History: Past Medical History:  Diagnosis Date   Chronic anticoagulation    Replaced with Pradaxa    CKD (chronic kidney disease), stage IV (HCC)    no nephrologist   Coronary artery disease    Dyslipidemia    Elevated serum creatinine    improved with gentle hydration   Hypercholesterolemia    Hyperlipidemia    Hypertension    Hypotension    Postoperative hypotension improved with holding medication   PAF (paroxysmal atrial fibrillation) (HCC)    Spinal stenosis     L4-5   Stroke (HCC) 5-6 yrs ago    Status post left hemisphere stroke with right-sided weakness   Past Surgical History: Past Surgical History:  Procedure Laterality Date   BACK SURGERY   5 yrs ago  lower   CATARACT EXTRACTION Bilateral yrs ago   CHOLECYSTECTOMY  yrs ago   PARTIAL PROCTECTOMY BY TEM N/A 08/05/2013   Procedure: rigid proctoscopy, flexible sigmoidoscopy, proctectomy by transanal endoscopic microsurgery;  Surgeon: Bernarda Ned, MD;  Location: [facility];  Service: General;  Laterality: N/A;   Social:  Lives With: wife, daughter supports him daily Occupation: None Level of Function: dependent, uses walker for transferring at baseline  PCP: Tanda Prentice DEL, MD Substances: No alcohol , tobacco or drug use.   Family History:  Family History  Problem Relation Age  of Onset   Hypertension Mother    Stroke Mother    Heart disease Mother    Heart disease Father    Diabetes Father    Cancer Father         history of prostate, bladder and colon cancer   Diabetes Brother    Diabetes Brother    Allergies: Allergies as of 06/24/2023   (No Known Allergies)   Review of Systems: A complete ROS was negative except as per HPI.   OBJECTIVE:   Physical Exam: Blood pressure 91/61, pulse (!) 112, temperature 98.7 F (37.1 C), temperature source Oral, resp. rate (!) 30, SpO2 98%.  Constitutional: ill-appearing older man, in moderate distress Cardiovascular: Sinus tachycardia with intermittent nonsustained V. tach, no m/r/g; trace bilateral lower extremity edema Pulmonary/Chest: decreased breath sounds on the right throughout lung, left side unremarkable but exam limited by patient positioning; tachypneic Abdominal: Nontender, mild distention, soft, bowel sounds present MSK: neuropathic pain in left leg, no tenderness to palpation along spine Neurological: alert & oriented x 3, coarse resting tremor in left arm, residual left-sided hemiparesis, greater in LLE, dysarthric. No other focal neuro deficits. Skin: warm and dry  Labs: CBC    Component Value Date/Time   WBC 21.0 (H) 06/24/2023 2230   RBC 3.18 (L) 06/24/2023 2230   HGB 8.2 (L) 06/24/2023 2241   HCT 24.0 (L) 06/24/2023 2241   PLT 358 06/24/2023 2230   MCV 82.4 06/24/2023 2230   MCH 24.8 (L) 06/24/2023 2230   MCHC 30.2 06/24/2023 2230   RDW 18.4 (H) 06/24/2023 2230   LYMPHSABS 0.6 (L) 06/24/2023 1636   MONOABS 0.8 06/24/2023 1636   EOSABS 0.1 06/24/2023 1636   BASOSABS 0.0 06/24/2023 1636     CMP     Component Value Date/Time   NA 138 06/24/2023 2241   K 4.8 06/24/2023 2241   CL 107 06/24/2023 1638   CO2 19 (L) 06/24/2023 1638   GLUCOSE 104 (H) 06/24/2023 1638   BUN 59 (H) 06/24/2023 1638   CREATININE 2.42 (H) 06/24/2023 1638   CALCIUM  8.4 (L) 06/24/2023 1638   PROT 6.8  06/24/2023 1638   ALBUMIN 2.4 (L) 06/24/2023 1638   AST 13 (L) 06/24/2023 1638   ALT 11 06/24/2023 1638   ALKPHOS 62 06/24/2023 1638   BILITOT 0.6 06/24/2023 1638   GFRNONAA 26 (L) 06/24/2023 1638   GFRAA 32 (L) 08/01/2013 0940   Urinalysis: Unremarkable  Imaging: CT CHEST ABDOMEN PELVIS WO CONTRAST CLINICAL DATA:  Sepsis  EXAM: CT CHEST, ABDOMEN AND PELVIS WITHOUT CONTRAST  TECHNIQUE: Multidetector CT imaging of the chest, abdomen and pelvis was performed following the standard protocol without IV contrast.  RADIATION DOSE REDUCTION: This exam was performed according to the departmental dose-optimization program which includes automated exposure control, adjustment of the mA and/or kV according to patient size and/or use of iterative reconstruction technique.  COMPARISON:  06/24/2023  FINDINGS: CT CHEST FINDINGS  Cardiovascular: Unenhanced imaging of the heart demonstrates cardiomegaly and trace pericardial effusion. Diffuse atherosclerosis of the coronary vasculature.  Normal caliber of the thoracic aorta. Atherosclerosis of the aortic arch. Evaluation of the vascular lumen is limited without IV contrast.  Mediastinum/Nodes: Borderline enlarged mediastinal lymph nodes, largest in the precarinal region measuring 10 mm. Thyroid, trachea, and esophagus are unremarkable.  Lungs/Pleura: There is a large partially loculated right pleural effusion, with extensive consolidation most pronounced in the right middle and right lower lobes. There is a small free-flowing left pleural effusion, with patchy left lower lobe and lingular airspace disease. No pneumothorax. Central airways are patent.  Musculoskeletal: No acute or destructive bony abnormalities. Reconstructed images demonstrate no additional findings.  CT ABDOMEN PELVIS FINDINGS  Hepatobiliary: Cholecystectomy. Unremarkable unenhanced appearance of the liver.  Pancreas: Unremarkable unenhanced  appearance.  Spleen: Unremarkable unenhanced appearance.  Adrenals/Urinary Tract: Bilateral renal cortical atrophy. Bilateral renal cortical cysts do not require specific imaging follow-up. No urinary tract calculi or obstruction. The adrenals are unremarkable. Bladder is minimally distended, limiting its evaluation.  Stomach/Bowel: No bowel obstruction or ileus. No bowel wall thickening or inflammatory change.  Vascular/Lymphatic: Aortic atherosclerosis. No enlarged abdominal or pelvic lymph nodes.  Reproductive: Mild enlargement of the prostate.  Other: No free fluid or free intraperitoneal gas. There is a right inguinal hernia, with a small segment of small bowel extending into the proximal aspect of the hernia. No incarceration or obstruction.  Musculoskeletal: No acute or destructive bony abnormalities. Reconstructed images demonstrate no additional findings.  IMPRESSION: 1. Large partially loculated right pleural effusion, with extensive right middle and right lower lobe consolidation. This could reflect pneumonia or atelectasis. 2. Small free-flowing left pleural effusion, with minimal left basilar airspace disease. 3. Borderline enlarged mediastinal adenopathy, likely reactive. 4. Cardiomegaly, with trace pericardial effusion. 5. No acute intra-abdominal or intrapelvic process. 6. Right inguinal hernia, with a short segment of small bowel protruding through the proximal aspect of the hernia. No incarceration or obstruction. 7. Mild enlargement the prostate. 8. Aortic Atherosclerosis (ICD10-I70.0). Coronary artery atherosclerosis.  Electronically Signed   By: Ozell Daring M.D.   On: 06/24/2023 22:37 CT Head Wo Contrast CLINICAL DATA:  Headache, new onset (Age >= 51y)  EXAM: CT HEAD WITHOUT CONTRAST  TECHNIQUE: Contiguous axial images were obtained from the base of the skull through the vertex without intravenous contrast.  RADIATION DOSE REDUCTION: This  exam was performed according to the departmental dose-optimization program which includes automated exposure control, adjustment of the mA and/or kV according to patient size and/or use of iterative reconstruction technique.  COMPARISON:  12/10/2007  FINDINGS: Brain: There is atrophy and chronic small vessel disease changes. No acute intracranial abnormality. Specifically, no hemorrhage, hydrocephalus, mass lesion, acute infarction, or significant intracranial injury.  Vascular: No hyperdense vessel or unexpected calcification.  Skull: No acute calvarial abnormality.  Sinuses/Orbits: No acute findings  Other: None  IMPRESSION: Atrophy, chronic microvascular disease.  No acute intracranial abnormality.  Electronically Signed   By: Franky Crease M.D.   On: 06/24/2023 18:08 DG Chest 1 View CLINICAL DATA:  Chest pain  EXAM: CHEST  1 VIEW  COMPARISON:  08/01/2013  FINDINGS: Large right pleural effusion. Bibasilar opacities, right greater than left could reflect atelectasis or infiltrates. Cardiomegaly, vascular congestion. No acute bony abnormality.  IMPRESSION: Large right pleural effusion.  Bibasilar atelectasis or infiltrates.  Cardiomegaly, vascular congestion.  Electronically Signed   By: Franky Crease M.D.   On: 06/24/2023 17:31  EKG: personally reviewed my interpretation  is sinus tachycardia, no acute ischemic changes.  No recent prior EKG for comparison.  ASSESSMENT & PLAN:  Assessment & Plan by Problem: Principal Problem:   Acute hypoxic respiratory failure (HCC)  Antonio Boley. is a 83 y.o. 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  Acute hypoxic respiratory failure Right sided pleural  effusion Sepsis Patient presented in sepsis with hypoxia and a new oxygen requirement, stable on 2L [place]. A large right-sided pleural effusion was identified on chest x-ray, most likely parapneumonic given his leukocytosis and septic presentation.  Other differentials considered include malignant effusion, congestive heart failure, pulmonary embolism, or aortic dissection.  He does not appear volume overloaded on exam. Left arm blood pressure 108/71, right arm blood pressure 96/63.  Chest pain has resolved, but he remains relatively hypotensive and tachycardic. We will continue him on IV antibiotics, fluids, and continue with the workup below.  - Continue vancomycin , zosyn  - LR 150 cc/hr - VBG  - CT chest, abdomen, pelvis without contrast - Echocardiogram - Lipase, BNP - PCCM contacted, will see him tomorrow  Non-sustained V Tach Non-sustained ventricular tachycardia noted on bedside monitor during exam.  Monomorphic in appearance.  Consulted cardiology who interrogated the other leads and stated that it was likely artifact in the setting of the patient's tremor and not true V. Tach.  Normocytic anemia, acute on chronic Hemoglobin 8.5 today, down from 10.5 in December.  MCV 82. Slight drop to 7.9 after fluid resuscitation. He denies any rectal bleeding or hematuria.  According to his daughter, he is on oral iron supplementation every other day outpatient for iron deficiency anemia.  - Trend CBC  Acute kidney injury CKD, stage 3b Creatinine is elevated at 2.42 on admission with a BUN of 59.  Most recent CMP for comparison showed a creatinine of 1.92 BUN of 57 from mid December.  He has a known history of CKD stage IIIb, but appears that now have an acute kidney injury in the setting of his sepsis. - Continue IV fluids - Avoid nephrotoxic meds - Daily BMP  Decreased appetite Daughter states that he has had a decreased appetite for over 1 year.  Hypoalbuminemia likely secondary to poor oral  intake.  May represent generalized deconditioning.   Chronic conditions: Essential hypertension Blood pressure slightly hypotensive at 91/61 on exam.  Received 2L LR bolus in the ED, now receiving LR at 150cc/hr. Holding home antihypertensive medications for now.  Per daughter, he recently d/c'd hydrochlorothiazide  for concerns of hypotension. Will continue to monitor blood pressure closely.   Chronic insomnia Stable. Continue trazodone  and melatonin.  Chronic lower back pain Neuropathy Continue gabapentin  300 mg twice daily. Dilaudid  0.5 mg IV q4h for breakthrough pain. Will avoid higher doses of gabapentin  as he previously was unable to tolerate them per chart review.  Resting tremor Left-sided resting tremor, chronic.  Has declined neurology referral for further evaluation per PCP.  Unclear etiology.  Atrial fibrillation Heart rate 110-120 on presentation, ECG showing sinus tachycardia.  On Xarelto  20 mg daily anticoagulation at home, which we will continue.   GERD On Protonix  40 mg daily, continued.  Diet: NPO VTE: DOAC IVF: LR, 150cc/hr Code: DNR  Prior to Admission Living Arrangement: Home, living with wife Anticipated Discharge Location: TBD Barriers to Discharge: Medical  workup and treatment  Dispo: Admit patient to Inpatient with expected length of stay greater than 2 midnights.  Signed: Ozell Riff, MD Internal Medicine Resident, PGY-1 [facility]  Pager: [phone]  06/25/2023, 12:29 AM

## 2023-06-24 NOTE — Hospital Course (Addendum)
 Acute hypoxic respiratory failure Right sided pleural effusion Sepsis Presented Afebrile. Leukocytosis at 21.0, tachycardic and hypotensive.Chest x-ray showed large right-sided effusion. CTAP showed large partially loculated right pleural effusion, with extensive right middle and right lower lobe consolidation. Concern for parapneumonic effusion. PCCM consulted for diagnostic thoracentesis, pending. Lipase normal. BNP 267.  -TTE *** - Continue vancomycin , zosyn  (day 2) - SLP evaluation with aspiration concern *** - Palliative care: DNR/DNI code change - PT/OT ***    Acute kidney injury CKD, stage 3b Creatinine is elevated at 2.63. Likely sepsis related. Renal US  without hydronephrosis, shows bilateral renal cysts (seen on 2014 imaging). - Continue PRN fluids with MAP <65 - Daily BMP - Strict I/O   Hyperkalemia 5.4 2/9. - 2/9 repeat BMP ***   Normocytic anemia, acute on chronic Hemoglobin 7.4 today. No signs of blood loss.  -Trend CBC -Transfuse for Hgb < 7   Decreased appetite Deconditioning Goals of Care Daughter states that he has had a decreased appetite for over 1 year. Wife endorses intermittent choking with food. - SLP evaluation - Palliative consult - PT/OT   Hx of CVA Dysarthric at baseline with residual left-sided weakness. No new deficit. - DOAC   Chronic conditions: Essential hypertension Holding home antihypertensive medications for now.  Per daughter, he recently d/c'd hydrochlorothiazide  for concerns of hypotension. -CTM   Chronic insomnia Stable. Continue trazodone  and melatonin.   Chronic lower back pain Neuropathy Continue home Gabapentin  300 mg twice daily. Dilaudid  0.5 mg IV q4h for breakthrough pain.   Resting tremor Left-sided resting tremor, chronic.  Has declined neurology referral for further evaluation per PCP.  Unclear etiology.   Atrial fibrillation Continue home Xarelto    GERD Continue home Protonix 

## 2023-06-24 NOTE — Progress Notes (Signed)
 PHARMACY - ANTICOAGULATION CONSULT NOTE  Pharmacy Consult for Xarelto  Indication: atrial fibrillation  No Known Allergies  Patient Measurements:    Vital Signs: Temp: 98.6 F (37 C) (02/08 2000) Temp Source: Oral (02/08 2000) BP: 93/56 (02/08 2000) Pulse Rate: 117 (02/08 2000)  Labs: Recent Labs    06/24/23 1636 06/24/23 1638 06/24/23 1906  HGB 8.5*  --   --   HCT 28.9*  --   --   PLT 426*  --   --   APTT  --  56*  --   LABPROT  --  19.1*  --   INR  --  1.6*  --   CREATININE  --  2.42*  --   TROPONINIHS 6  --  8    CrCl cannot be calculated (Unknown ideal weight.).   Medical History: Past Medical History:  Diagnosis Date   Chronic anticoagulation    Replaced with Pradaxa    CKD (chronic kidney disease), stage IV (HCC)    no nephrologist   Coronary artery disease    Dyslipidemia    Elevated serum creatinine    improved with gentle hydration   Hypercholesterolemia    Hyperlipidemia    Hypertension    Hypotension    Postoperative hypotension improved with holding medication   PAF (paroxysmal atrial fibrillation) (HCC)    Spinal stenosis     L4-5   Stroke (HCC) 5-6 yrs ago    Status post left hemisphere stroke with right-sided weakness    Assessment: Antonio [name] presenting with HA and CP, hx of Afib on Xarelto  PTA, SCr 2.42 on admission and will dose reduce from PTA 20mg  to 15mg  d/t renal function  Goal of Therapy:  Monitor platelets by anticoagulation protocol: Yes   Plan:  Xarelto  15mg  PO daily Monitor renal function, s/s bleeding  Dorn Poot, PharmD, [facility] Clinical Pharmacist ED Pharmacist Phone # [phone] 06/24/2023 9:15 PM

## 2023-06-24 NOTE — Sepsis Progress Note (Signed)
 Elink following for sepsis protocol.

## 2023-06-25 ENCOUNTER — Observation Stay (HOSPITAL_COMMUNITY): Payer: Medicare Other

## 2023-06-25 DIAGNOSIS — N179 Acute kidney failure, unspecified: Secondary | ICD-10-CM | POA: Insufficient documentation

## 2023-06-25 DIAGNOSIS — I4891 Unspecified atrial fibrillation: Secondary | ICD-10-CM | POA: Diagnosis not present

## 2023-06-25 DIAGNOSIS — J189 Pneumonia, unspecified organism: Secondary | ICD-10-CM | POA: Diagnosis present

## 2023-06-25 DIAGNOSIS — J9811 Atelectasis: Secondary | ICD-10-CM | POA: Diagnosis present

## 2023-06-25 DIAGNOSIS — I69354 Hemiplegia and hemiparesis following cerebral infarction affecting left non-dominant side: Secondary | ICD-10-CM | POA: Diagnosis not present

## 2023-06-25 DIAGNOSIS — Z1152 Encounter for screening for COVID-19: Secondary | ICD-10-CM | POA: Diagnosis not present

## 2023-06-25 DIAGNOSIS — N1832 Chronic kidney disease, stage 3b: Secondary | ICD-10-CM | POA: Diagnosis present

## 2023-06-25 DIAGNOSIS — Z7189 Other specified counseling: Secondary | ICD-10-CM

## 2023-06-25 DIAGNOSIS — A419 Sepsis, unspecified organism: Secondary | ICD-10-CM

## 2023-06-25 DIAGNOSIS — F02818 Dementia in other diseases classified elsewhere, unspecified severity, with other behavioral disturbance: Secondary | ICD-10-CM | POA: Diagnosis present

## 2023-06-25 DIAGNOSIS — E875 Hyperkalemia: Secondary | ICD-10-CM

## 2023-06-25 DIAGNOSIS — G9341 Metabolic encephalopathy: Secondary | ICD-10-CM | POA: Diagnosis present

## 2023-06-25 DIAGNOSIS — I130 Hypertensive heart and chronic kidney disease with heart failure and stage 1 through stage 4 chronic kidney disease, or unspecified chronic kidney disease: Secondary | ICD-10-CM | POA: Diagnosis present

## 2023-06-25 DIAGNOSIS — I472 Ventricular tachycardia, unspecified: Secondary | ICD-10-CM | POA: Diagnosis present

## 2023-06-25 DIAGNOSIS — R0609 Other forms of dyspnea: Secondary | ICD-10-CM

## 2023-06-25 DIAGNOSIS — J9601 Acute respiratory failure with hypoxia: Secondary | ICD-10-CM | POA: Diagnosis present

## 2023-06-25 DIAGNOSIS — R6521 Severe sepsis with septic shock: Secondary | ICD-10-CM | POA: Diagnosis present

## 2023-06-25 DIAGNOSIS — G309 Alzheimer's disease, unspecified: Secondary | ICD-10-CM | POA: Diagnosis present

## 2023-06-25 DIAGNOSIS — A403 Sepsis due to Streptococcus pneumoniae: Secondary | ICD-10-CM | POA: Diagnosis not present

## 2023-06-25 DIAGNOSIS — Z8673 Personal history of transient ischemic attack (TIA), and cerebral infarction without residual deficits: Secondary | ICD-10-CM

## 2023-06-25 DIAGNOSIS — Z66 Do not resuscitate: Secondary | ICD-10-CM | POA: Diagnosis present

## 2023-06-25 DIAGNOSIS — D649 Anemia, unspecified: Secondary | ICD-10-CM

## 2023-06-25 DIAGNOSIS — I480 Paroxysmal atrial fibrillation: Secondary | ICD-10-CM | POA: Diagnosis present

## 2023-06-25 DIAGNOSIS — J90 Pleural effusion, not elsewhere classified: Secondary | ICD-10-CM | POA: Insufficient documentation

## 2023-06-25 DIAGNOSIS — F5104 Psychophysiologic insomnia: Secondary | ICD-10-CM | POA: Diagnosis present

## 2023-06-25 DIAGNOSIS — Z515 Encounter for palliative care: Secondary | ICD-10-CM | POA: Diagnosis not present

## 2023-06-25 DIAGNOSIS — E8721 Acute metabolic acidosis: Secondary | ICD-10-CM | POA: Diagnosis present

## 2023-06-25 DIAGNOSIS — R6520 Severe sepsis without septic shock: Secondary | ICD-10-CM | POA: Diagnosis not present

## 2023-06-25 DIAGNOSIS — I69351 Hemiplegia and hemiparesis following cerebral infarction affecting right dominant side: Secondary | ICD-10-CM | POA: Diagnosis not present

## 2023-06-25 DIAGNOSIS — E8809 Other disorders of plasma-protein metabolism, not elsewhere classified: Secondary | ICD-10-CM | POA: Diagnosis present

## 2023-06-25 DIAGNOSIS — M5442 Lumbago with sciatica, left side: Secondary | ICD-10-CM | POA: Diagnosis present

## 2023-06-25 LAB — BASIC METABOLIC PANEL
Anion gap: 10 (ref 5–15)
Anion gap: 13 (ref 5–15)
BUN: 59 mg/dL — ABNORMAL HIGH (ref 8–23)
BUN: 61 mg/dL — ABNORMAL HIGH (ref 8–23)
CO2: 20 mmol/L — ABNORMAL LOW (ref 22–32)
CO2: 16 mmol/L — ABNORMAL LOW (ref 22–32)
Calcium: 8.1 mg/dL — ABNORMAL LOW (ref 8.9–10.3)
Calcium: 8.2 mg/dL — ABNORMAL LOW (ref 8.9–10.3)
Chloride: 106 mmol/L (ref 98–111)
Chloride: 106 mmol/L (ref 98–111)
Creatinine, Ser: 2.63 mg/dL — ABNORMAL HIGH (ref 0.61–1.24)
Creatinine, Ser: 2.88 mg/dL — ABNORMAL HIGH (ref 0.61–1.24)
GFR, Estimated: 23 mL/min — ABNORMAL LOW (ref >60)
GFR, Estimated: 21 mL/min — ABNORMAL LOW (ref >60)
Glucose, Bld: 106 mg/dL — ABNORMAL HIGH (ref 70–99)
Glucose, Bld: 92 mg/dL (ref 70–99)
Potassium: 5.4 mmol/L — ABNORMAL HIGH (ref 3.5–5.1)
Potassium: 5.3 mmol/L — ABNORMAL HIGH (ref 3.5–5.1)
Sodium: 136 mmol/L (ref 135–145)
Sodium: 135 mmol/L (ref 135–145)

## 2023-06-25 LAB — CBC WITH DIFFERENTIAL/PLATELET
Abs Immature Granulocytes: 0.23 10*3/uL — ABNORMAL HIGH (ref 0.00–0.07)
Basophils Absolute: 0 10*3/uL (ref 0.0–0.1)
Basophils Relative: 0 %
Eosinophils Absolute: 0 10*3/uL (ref 0.0–0.5)
Eosinophils Relative: 0 %
HCT: 25.3 % — ABNORMAL LOW (ref 39.0–52.0)
Hemoglobin: 7.4 g/dL — ABNORMAL LOW (ref 13.0–17.0)
Immature Granulocytes: 1 %
Lymphocytes Relative: 5 %
Lymphs Abs: 0.9 10*3/uL (ref 0.7–4.0)
MCH: 24.4 pg — ABNORMAL LOW (ref 26.0–34.0)
MCHC: 29.2 g/dL — ABNORMAL LOW (ref 30.0–36.0)
MCV: 83.5 fL (ref 80.0–100.0)
Monocytes Absolute: 1.2 10*3/uL — ABNORMAL HIGH (ref 0.1–1.0)
Monocytes Relative: 7 %
Neutro Abs: 16.1 10*3/uL — ABNORMAL HIGH (ref 1.7–7.7)
Neutrophils Relative %: 87 %
Platelets: 329 10*3/uL (ref 150–400)
RBC: 3.03 MIL/uL — ABNORMAL LOW (ref 4.22–5.81)
RDW: 18.3 % — ABNORMAL HIGH (ref 11.5–15.5)
WBC: 18.6 10*3/uL — ABNORMAL HIGH (ref 4.0–10.5)
nRBC: 0 % (ref 0.0–0.2)

## 2023-06-25 LAB — BLOOD GAS, VENOUS
Acid-base deficit: 4.8 mmol/L — ABNORMAL HIGH (ref 0.0–2.0)
Bicarbonate: 21.6 mmol/L (ref 20.0–28.0)
O2 Saturation: 19.3 %
Patient temperature: 37
pCO2, Ven: 44 mm[Hg] (ref 44–60)
pH, Ven: 7.3 (ref 7.25–7.43)
pO2, Ven: <31 mm[Hg] — CL (ref 32–45)

## 2023-06-25 LAB — ECHOCARDIOGRAM COMPLETE
AR max vel: 2.28 cm2
AV Peak grad: 6.3 mm[Hg]
Ao pk vel: 1.25 m/s
Est EF: 55
MV M vel: 3.74 m/s
MV Peak grad: 56 mm[Hg]
Radius: 0.4 cm
S' Lateral: 2.8 cm

## 2023-06-25 LAB — LIPASE, BLOOD: Lipase: 18 U/L (ref 11–51)

## 2023-06-25 LAB — I-STAT CG4 LACTIC ACID, ED: Lactic Acid, Venous: 0.8 mmol/L (ref 0.5–1.9)

## 2023-06-25 LAB — CBG MONITORING, ED: Glucose-Capillary: 87 mg/dL (ref 70–99)

## 2023-06-25 LAB — LACTIC ACID, PLASMA: Lactic Acid, Venous: 1.7 mmol/L (ref 0.5–1.9)

## 2023-06-25 MED ORDER — FENTANYL CITRATE PF 50 MCG/ML IJ SOSY
50.0000 ug | PREFILLED_SYRINGE | INTRAMUSCULAR | Status: DC | PRN
  Administered 2023-06-25 – 2023-06-26 (×5): 50 ug via INTRAVENOUS
  Filled 2023-06-25 (×5): qty 1

## 2023-06-25 MED ORDER — FOOD THICKENER (SIMPLYTHICK): 10.0000 | ORAL | Status: DC | PRN

## 2023-06-25 MED ORDER — DICLOFENAC SODIUM 1 % EX GEL
2.0000 g | Freq: Four times a day (QID) | CUTANEOUS | Status: DC | PRN
  Administered 2023-06-26: 2 g via TOPICAL
  Filled 2023-06-25: qty 100

## 2023-06-25 MED ORDER — HYDROMORPHONE HCL 1 MG/ML IJ SOLN: 1.0000 mg | Freq: Four times a day (QID) | INTRAMUSCULAR | Status: DC | PRN

## 2023-06-25 MED ORDER — CAPSAICIN 0.025 % EX CREA
1.0000 | TOPICAL_CREAM | Freq: Four times a day (QID) | CUTANEOUS | Status: DC | PRN
  Administered 2023-06-26: 1 via TOPICAL
  Filled 2023-06-25: qty 60

## 2023-06-25 MED ORDER — OXYCODONE HCL 5 MG PO TABS
5.0000 mg | ORAL_TABLET | Freq: Three times a day (TID) | ORAL | Status: DC
  Administered 2023-06-26: 5 mg via ORAL
  Filled 2023-06-25 (×2): qty 1

## 2023-06-25 MED ORDER — ACETAMINOPHEN 650 MG RE SUPP
650.0000 mg | Freq: Four times a day (QID) | RECTAL | Status: DC
  Administered 2023-06-25: 650 mg via RECTAL
  Filled 2023-06-25: qty 1

## 2023-06-25 MED ORDER — HYDROXYZINE HCL 10 MG PO TABS
10.0000 mg | ORAL_TABLET | Freq: Once | ORAL | Status: AC
  Administered 2023-06-26: 10 mg via ORAL
  Filled 2023-06-25: qty 1

## 2023-06-25 MED ORDER — OXYCODONE HCL 5 MG PO TABS: 5.0000 mg | ORAL_TABLET | Freq: Three times a day (TID) | ORAL | Status: DC

## 2023-06-25 MED ORDER — ACETAMINOPHEN 500 MG PO TABS
1000.0000 mg | ORAL_TABLET | Freq: Four times a day (QID) | ORAL | Status: DC
  Administered 2023-06-26 (×2): 1000 mg via ORAL
  Filled 2023-06-25 (×2): qty 2

## 2023-06-25 MED ORDER — LACTATED RINGERS IV BOLUS
500.0000 mL | Freq: Once | INTRAVENOUS | Status: AC
  Administered 2023-06-25: 500 mL via INTRAVENOUS

## 2023-06-25 MED ORDER — VANCOMYCIN HCL 1250 MG/250ML IV SOLN: 1250.0000 mg | INTRAVENOUS | Status: DC

## 2023-06-25 MED ORDER — PIPERACILLIN-TAZOBACTAM 3.375 G IVPB
3.3750 g | Freq: Three times a day (TID) | INTRAVENOUS | Status: DC
  Administered 2023-06-25 – 2023-06-26 (×5): 3.375 g via INTRAVENOUS
  Filled 2023-06-25 (×4): qty 50

## 2023-06-25 MED ORDER — ACETAMINOPHEN 500 MG PO TABS: 1000.0000 mg | ORAL_TABLET | Freq: Four times a day (QID) | ORAL | Status: DC

## 2023-06-25 MED ORDER — LACTATED RINGERS IV SOLN
INTRAVENOUS | Status: AC
  Administered 2023-06-25: 100 mL/h via INTRAVENOUS

## 2023-06-25 NOTE — H&P (Addendum)
 Palliative Medicine Inpatient Consult Note  Consulting Provider: Elnora Ip, MD   Reason for consult:   Palliative Care Consult Services Palliative Medicine Consult   Symptom Management Consult  Reason for Consult? Decline and caregiver burnout. 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    The Palliative Medicine team has been asked to get involved to further support goals of care conversations.   Clinical Assessment/Goals of Care:  *Please note that this is a verbal dictation therefore any spelling or grammatical errors are due to the Dragon Medical One system interpretation.  I have reviewed medical records including EPIC notes, labs and imaging, received report from bedside RN, assessed the patient who is lying in bed in general discomfort, he continued to grab at his leg and say he was hurting.    I met with Antonio [name] and his daughter, Antonio [name]  to further discuss diagnosis prognosis, GOC, EOL wishes, disposition and options.   I introduced Palliative Medicine as specialized medical care for people living with serious illness. It focuses on providing relief from the symptoms and stress of a serious illness. The goal is to improve quality of life for both the patient and the family.  Medical History Review and Understanding:  A review of Antonio [name] past medical history inclusive of Afib, CVA with residual L sided weakness, BCC,  HTN, HLD, and Stg III CKD was completed.   Social History:  Antonio [name] lives in [place], [place] . He has been married to his wife for sixty-one years. He has one daughter, four grandchildren, and eight great  grandchildren. 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 He daughter shares that he has liked to remain busy throughout his life.   Functional and Nutritional State:  Preceding hospitalization, Antonio [name] was wheelchair bound. His daughter shares that she would have someone help him into his wheelchair in the morning and back in his bed at night. He on his best daughters can possibly stand and pivot though this has become less of a realty over time. Patients daughter checks in on he and her other at night. Her husband has helped with bathing Antonio [name] though this is becoming more challenging.   Antonio [name] has been eating less over the last year or so.   Palliative Symptoms:  Pain - Patient shares that it is in his lower back from spinal stenosis. He notes that is goes to his feet. Patients daughter shares he use to take hydrocodone which helped. He is on gabapentin  though at a lower does than prior as he was experiencing night terrors. Antonio [name] pain has been more difficult to manage over the past few months.  Advance Directives:  A detailed discussion was had today regarding advanced directives.  Patients daughter shares that she is his HCPOA. I have requested that she bring in the documents to support this.   Code Status:  Concepts specific to code status, artifical feeding and hydration, continued IV antibiotics and rehospitalization was had.  The difference between a aggressive medical intervention path  and a palliative comfort care path for this patient at this time was had.   Antonio [name] is an established DNAR/DNI code status. Antonio [name] Antonio [name] and  I reviewed the MOST form and filled it in based upon patient wishes. Antonio [name] will review it overnight and bring it in for formal signature in the oncoming day(s).   Provided  Hard Choices for Antonio [name] booklet.   Discussion:  We discussed Antonio [name]' reason for admission. He shares that he was having chest pain. Reviewed patients sepsis  and R sided pleural effusion.  We reviewed the chronic disease trajectory in patients who have multiple co-morbidities. I shared that often an event will occur leading to an  acute hospitalizationsuch as a fall, UTI, PNA, heart failure exacerbation, copd exacerbation, or another illness sort. We discussed that patients may have been functioning at a high plateau initially, then an acute event occurs. We discussed that after this event their function, mental, and nutritional states are compromised. Often with treatment and rehabilitation there is some regain in each individuals health, though often not to their prior baseline level. We discussed that then another event will occur causing a rehospitalization and a further decline. I shared that often this will become a pattern and each event causes greater burden to the individual, depleting them further or their function, cognition, or nutritional state.   Discussed the options moving forward inclusive of OP Palliative support. I described palliative care as specialized medical care for people living with a serious illness, such as cancer, heart failure, COPD, Alzheimer's dementia, etc. Patients in palliative care may receive medical care for their symptoms, or palliative care, along with treatment intended to cure their serious illness.  We discussed that if Antonio [name] continues to decline despite present interventions it may be of utility to consider hospice support. I reviewed that hospice care focuses on the care, comfort, and quality of life of a person with a serious illness who is approaching the end of life. At some point, it may not be possible to cure a serious illness, or a patient may choose not to undergo certain treatments. Hospice is designed for this situation.  At this time patients daughter is hoping for OP Palliative support. We discussed [facility] palliative arm to maintain patient symptoms once he transitions  home.   Discussed the importance of continued conversation with family and their  medical providers regarding overall plan of care and treatment options, ensuring decisions are within the context of the patients values and GOCs.  Decision Maker: Butler Antonio [name] (Daughter): [phone] (Mobile)   SUMMARY OF RECOMMENDATIONS   DNAR/DNI --> Have provided and reviewed a MOST form for completion  Have requested a copy of patients AD's to scan into [employer]  Discussed the chronic disease trajectory as related to Mr. Busche  Explained benefits of Palliative vs. Hospice support --> At this time daughter interested in OP Palliative support  Symptoms as below  Patients daughter has requested a hoyer lift for home  Ongoing PMT support  Code Status/Advance Care Planning: DNAR/DNI   Symptom Management:  Chronic Pain d/t spinal stenosis: - Continue Gabapentin  300mg  PO BID - Add Oxycodone  5mg  PO TID and Q4H PRN - Would consider adding Cymbalta vs. Lyrica  - Continue high dose Tylenol  - though would change to TID - Continue dilaudid  for breakthrough pain - Will need a good bowel regiment instituted  Palliative Prophylaxis:  Aspiration, Bowel Regimen, Delirium Protocol, Frequent Pain Assessment, Oral Care, Palliative Wound Care, and Turn Reposition  Additional Recommendations (Limitations, Scope, Preferences): Continue present care  Psycho-social/Spiritual:  Desire for further Chaplaincy support: Not at this time Additional Recommendations: Education on chronic  disease   Prognosis: (+) high disease burden, (+) decreased PO's, (+) decreased ambulation --> inc. 12 month mortality risk.   Discharge Planning: Discharge plan to be determined.   Vitals:   06/25/23 1029 06/25/23 1100  BP: (!) 100/55 93/66  Pulse: (!) 118 (!) 122  Resp: 16 19  Temp:    SpO2: 98% 90%    Intake/Output Summary (Last 24 hours) at 06/25/2023 1140 Last data filed at 06/25/2023 9041 Gross per 24 hour  Intake  2797.13 ml  Output --  Net 2797.13 ml   Gen:  Elderly Caucasian M in moderate distress HEENT: moist mucous membranes CV: Irregular rate and rhythm  PULM:  On 2LPM [place], breathing is even and nonlabored ABD: soft/nontender  EXT: No edema  Neuro: Alert and oriented x3   PPS: 20-30%   This conversation/these recommendations were discussed with patient primary care team, Dr. Kristy  Total Time: 59 Billing based on MDM: High ______________________________________________________ Rosaline Becton [place] Palliative Medicine Team Team Cell Phone: [phone] Please utilize secure chat with additional questions, if there is no response within 30 minutes please call the above phone number  Palliative Medicine Team providers are available by phone from 7am to 7pm daily and can be reached through the team cell phone.  Should this patient require assistance outside of these hours, please call the patient's attending physician.

## 2023-06-25 NOTE — Evaluation (Signed)
 Clinical/Bedside Swallow Evaluation Patient Details  Name: Antonio [name]. MRN: [id number] Date of Birth: 14-Jan-1941  Today's Date: 06/25/2023 Time: SLP Start Time (ACUTE ONLY): 1105 SLP Stop Time (ACUTE ONLY): 1125 SLP Time Calculation (min) (ACUTE ONLY): 20 min  Past Medical History:  Past Medical History:  Diagnosis Date   Chronic anticoagulation    Replaced with Pradaxa    CKD (chronic kidney disease), stage IV (HCC)    no nephrologist   Coronary artery disease    Dyslipidemia    Elevated serum creatinine    improved with gentle hydration   Hypercholesterolemia    Hyperlipidemia    Hypertension    Hypotension    Postoperative hypotension improved with holding medication   PAF (paroxysmal atrial fibrillation) (HCC)    Spinal stenosis     L4-5   Stroke (HCC) 5-6 yrs ago    Status post left hemisphere stroke with right-sided weakness   Past Surgical History:  Past Surgical History:  Procedure Laterality Date   BACK SURGERY   5 yrs ago   lower   CATARACT EXTRACTION Bilateral yrs ago   CHOLECYSTECTOMY  yrs ago   PARTIAL PROCTECTOMY BY TEM N/A 08/05/2013   Procedure: rigid proctoscopy, flexible sigmoidoscopy, proctectomy by transanal endoscopic microsurgery;  Surgeon: Bernarda Ned, MD;  Location: [facility];  Service: General;  Laterality: N/A;   HPI:  Patient is an 83 y.o. 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 He presented to the hospital on 06/24/23 for evaluation of chest pain. He was found to be septic with a right sided loculated parapneumonic effusion. Spouse reported he does frequently cough when eating at baseline. SLP swallow evaluation ordered secondary to concern for aspiration.    Assessment / Plan / Recommendation  Clinical Impression  Patient presents with clinical s/s of dysphagia as per this BSE. His daughter reported that at home he ate softer solids but regular liquids. During this evaluation,  patient was distracted by pain which he indicated was his buttocks. Oral mucosa was dry but no secretions observed. Patient's swallow assessed with puree solids (applesauce), thin liquids (water) and nectar thick juice. With thin liquids, he exhibited suspected swallow initiation delay with first sip and mildly delayed cough response with second sip. No overt s/s with puree solids or nectar thick liquids. Patient with left arm tremor which is baseline per daughter and this did increase at times and cause shaking of entire upper body. SLP discussed results and recommendations with patient's sister and she was in agreement. SLP ideally would like to do a modified barium swallow study with patient, however will likely need to coordinate this with his pain medication schedule as he is currently displaying significant pain complaints and having difficulty focusing on PO's. His daughter reported that he gets sleepy after pain medication but is calm. SLP will follow. SLP Visit Diagnosis: Dysphagia, unspecified (R13.10)    Aspiration Risk  Mild aspiration risk;Risk for inadequate nutrition/hydration    Diet Recommendation Nectar-thick liquid;Dysphagia 1 (Puree)    Liquid Administration via: Cup;Straw Medication Administration: Whole meds with puree Supervision: Full supervision/cueing for compensatory strategies;Staff to assist with self feeding Compensations: Slow rate;Small sips/bites Postural Changes: Seated upright at 90 degrees    Other  Recommendations Oral Care Recommendations: Oral care BID;Staff/trained caregiver to provide oral care Caregiver Recommendations: Have oral suction available;Avoid jello, ice cream, thin soups, popsicles;Remove water pitcher    Recommendations for follow up therapy are one component of  a multi-disciplinary discharge planning process, led by the attending physician.  Recommendations may be updated based on patient status, additional functional criteria and insurance  authorization.  Follow up Recommendations Other (comment) (SLP at next venue of care)      Assistance Recommended at Discharge    Functional Status Assessment Patient has had a recent decline in their functional status and demonstrates the ability to make significant improvements in function in a reasonable and predictable amount of time.  Frequency and Duration min 2x/week  2 weeks       Prognosis Prognosis for improved oropharyngeal function: Good Barriers to Reach Goals: Cognitive deficits      Swallow Study   General Date of Onset: 06/24/23 HPI: Patient is an 83 y.o. 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 He presented to the hospital on 06/24/23 for evaluation of chest pain. He was found to be septic with a right sided loculated parapneumonic effusion. Spouse reported he does frequently cough when eating at baseline. SLP swallow evaluation ordered secondary to concern for aspiration. Type of Study: Bedside Swallow Evaluation Previous Swallow Assessment: Patient's CVA was remote and therapy notes from 2011 do not include SLP treatment; patient's daughter reported SLP therapy that worked on speech and swallowing Diet Prior to this Study: Thin liquids (Level 0);Dysphagia 3 (mechanical soft) Temperature Spikes Noted: No Respiratory Status: Nasal cannula History of Recent Intubation: No Behavior/Cognition: Alert;Cooperative;Confused;Requires cueing;Distractible Oral Cavity Assessment: Dry Oral Care Completed by SLP: Yes Oral Cavity - Dentition: Poor condition;Adequate natural dentition Self-Feeding Abilities: Total assist;Needs assist Patient Positioning: Upright in bed Baseline Vocal Quality: Normal Volitional Cough: Weak;Congested Volitional Swallow: Able to elicit    Oral/Motor/Sensory Function Overall Oral Motor/Sensory Function: Generalized oral weakness   Ice Chips     Thin Liquid Thin Liquid:  Impaired Presentation: Cup Oral Phase Impairments: Reduced labial seal Pharyngeal  Phase Impairments: Suspected delayed Swallow;Cough - Delayed    Nectar Thick Nectar Thick Liquid: Within functional limits Presentation: Cup   Honey Thick     Puree Puree: Within functional limits Presentation: Spoon   Solid     Solid: Not tested     Norleen IVAR Blase, MA, CCC-SLP Speech Therapy

## 2023-06-25 NOTE — Progress Notes (Signed)
 Echocardiogram 2D Echocardiogram has been performed.  Keslee Harrington N Hanford Lust,RDCS 06/25/2023, 10:16 AM

## 2023-06-25 NOTE — ED Notes (Signed)
Pt transported to Ultrasound.  

## 2023-06-25 NOTE — Progress Notes (Addendum)
 Subjective Denies chest pain. SOB mildly improved.   Wife at bedside during morning exam. Aspirates sometimes when he is eating. Feeling better this AM after dilaudid . No abdominal pain.   Physical exam Blood pressure 95/63, pulse (!) 117, temperature 99.9 F (37.7 C), temperature source Axillary, resp. rate (!) 25, SpO2 95%.  Physical Exam: Constitutional: chronically ill-appearing, lying in bed, in no acute distress Cardiovascular: tachycardic, regular rhythm, no m/r/g Pulmonary/Chest: normal work of breathing on 2 L, decreased right-sided lung sounds, left CTA Abdominal: soft, non-tender, non-distended Neurological: answered questions appropriately Skin: warm and dry Psych: normal mood and behavior   Weight change:    Intake/Output Summary (Last 24 hours) at 06/25/2023 0759 Last data filed at 06/25/2023 0500 Gross per 24 hour  Intake 2746.5 ml  Output --  Net 2746.5 ml   Net IO Since Admission: 2,746.5 mL [06/25/23 0759]  Labs, images, and other studies    Latest Ref Rng & Units 06/25/2023    3:46 AM 06/24/2023   10:41 PM 06/24/2023   10:30 PM  CBC  WBC 4.0 - 10.5 K/uL 18.6   21.0   Hemoglobin 13.0 - 17.0 g/dL 7.4  8.2  7.9   Hematocrit 39.0 - 52.0 % 25.3  24.0  26.2   Platelets 150 - 400 K/uL 329   358        Latest Ref Rng & Units 06/25/2023    3:46 AM 06/24/2023   10:41 PM 06/24/2023    4:38 PM  BMP  Glucose 70 - 99 mg/dL 893   895   BUN 8 - 23 mg/dL 59   59   Creatinine 9.38 - 1.24 mg/dL 7.36   7.57   Sodium 864 - 145 mmol/L 136  138  137   Potassium 3.5 - 5.1 mmol/L 5.4  4.8  4.6   Chloride 98 - 111 mmol/L 106   107   CO2 22 - 32 mmol/L 20   19   Calcium  8.9 - 10.3 mg/dL 8.1   8.4      Assessment and plan Hospital day 1  Antonio [name]. 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   Principal Problem:   Acute hypoxic respiratory failure (HCC)  Acute hypoxic respiratory failure Right sided pleural effusion Sepsis Afebrile. Leukocytosis improved to 18.6, remains tachycardic with soft BP's. Saturating > 90% on 2 L. Chest x-ray shows large right-sided effusion. CTAP showed large partially loculated right pleural effusion, with extensive right middle and right lower lobe consolidation. Imaging consistent with parapneumonic effusion. PCCM consulted, for diagnostic thoracentesis, pending. Lipase normal. BNP 267. TTE pending - Continue vancomycin , zosyn  (day 2) - SLP evaluation with aspiration concern   Acute kidney injury CKD, stage 3b Creatinine is elevated at 2.63. Likely sepsis related. Renal US  without hydronephrosis, shows bilateral renal cysts (seen on 2014 imaging). - Daily BMP - Strict I/O  Hyperkalemia 5.4 this AM. Repeat BMP pending.  -Trend BMP   Normocytic anemia, acute on chronic Hemoglobin 7.4 today. No signs of blood loss.  -Trend CBC -Transfuse for Hgb < 7   Decreased appetite Deconditioning Goals of Care Daughter states that he has had a decreased appetite for over  1 year. Wife endorses intermittent choking with food. - SLP evaluation - Palliative consult - PT/OT   Hx of CVA Dysarthric at baseline with residual left-sided weakness. No new deficit. - DOAC  Chronic conditions: Essential hypertension Holding home antihypertensive medications for now in setting of hypotension.  Per daughter, he recently d/c'd hydrochlorothiazide  for concerns of hypotension. -CTM   Chronic insomnia Stable. Continue trazodone  and melatonin.   Chronic lower back pain Neuropathy Continue home Gabapentin  300 mg twice daily. Dilaudid  0.5 mg IV q4h for breakthrough pain.   Resting tremor Left-sided resting tremor, chronic.  Has declined neurology referral for further  evaluation per PCP.  Unclear etiology.   Atrial fibrillation Continue home Xarelto    GERD Continue home Protonix   Diet: Dysphagia 3 VTE: DOAC IVF: LR, 150cc/hr Code: DNR  Norman Lobstein, DO 06/25/2023, 7:59 AM  Pager: [phone] After 5pm or weekend: [phone]   Internal Medicine Attending:  I was physically present during the critical or key portions of the resident provided service and participated in formulating the plan of care and medical decision making for the patient as documented in the resident's note.  Antonio Dayton BROCKS, DO

## 2023-06-25 NOTE — ED Notes (Signed)
 RN noted what appeared to be short run of VTAC on cardiac monitor. EKG strip printed, admitting team notified. No change in pt condition.

## 2023-06-25 NOTE — Consult Note (Signed)
 NAME:  Antonio [name], Kisner MRN:  [id number], DOB:  November 06, 1940, LOS: 0 ADMISSION DATE:  06/24/2023, CONSULTATION DATE: 06/25/2023 REFERRING MD: Camellia Eastern, DO, CHIEF COMPLAINT: Pleural effusion  History of Present Illness:   83 year old male patient with multiple comorbidities as listed below.  Presented to the emergency room On 2/8 with chief complaint of generalized chest pressure radiating to shoulders and back, no nausea vomiting chills fever or abdominal pain has had chronic issue with anorexia.  Seen in the emergency room placed on supplemental oxygen initially afebrile tachycardic, A-fib with RVR profoundly weak initial white blood cell count 15.2 respiratory viral panel was negative, creatinine 2.42 up from baseline of around 2 HCO3 19 glucose 104 INR 1.6, lactate 1.7 procalcitonin 0.14, initial troponin 8, BNP 267.  ECG showed sinus tachycardia with first-degree heart block and multiple PVCs, CT of chest pelvis and abdomen showed a large partially loculated right pleural effusion with extensive right middle lobe and right lower lobe consolidation there was a small free-flowing left effusion had some mild mediastinal adenopathy abdomen was negative he did have a mildly enlarged prostate.  CT of head was negative. He was placed on supplemental oxygen, placed on broad-spectrum antibiotics for loculated effusion and right lower lobe middle lobe consolidation Pulmonary asked to evaluate for loculated effusion.  Pertinent  Medical History  Atrial fibrillation on chronic anticoagulation prior stroke with residual left-sided weakness, dysarthria, basal cell carcinoma of the nose, hypertension, hyperlipidemia, stage IIIb CKD, neuropathy with tremor on gabapentin , GERD  Significant Hospital Events: Including procedures, antibiotic start and stop dates in addition to other pertinent events     Interim History / Subjective:    Objective   Blood pressure 93/66, pulse (!) 122, temperature 98.6 F (37  C), temperature source Axillary, resp. rate 19, SpO2 90%.        Intake/Output Summary (Last 24 hours) at 06/25/2023 1221 Last data filed at 06/25/2023 9041 Gross per 24 hour  Intake 2797.13 ml  Output --  Net 2797.13 ml   There were no vitals filed for this visit.  Examination: Gen:      No acute distress, chronically ill-appearing HEENT:  EOMI, sclera anicteric Neck:     No masses; no thyromegaly Lungs:    Diminished breath sounds on the right CV:         Regular rate and rhythm; no murmurs Abd:      + bowel sounds; soft, non-tender; no palpable masses, no distension Ext:    No edema; adequate peripheral perfusion Skin:      Warm and dry; no rash Neuro: Somnolent after receiving Dilaudid , left hand tremor  Lab/imaging reviewed Significant for sodium 135, potassium 5.3 BUN/creatinine 61/2.88 WBC 18.6, hemoglobin 7.4, platelets 329 Lactic acid improved to 0.8  CT abdomen pelvis reviewed with large partially loculated right effusion with right middle lobe and lower lobe consolidation small left effusion I have reviewed the images personally.  Resolved Hospital Problem list     Assessment & Plan:  Large loculated right pleural effusion Right lower and middle lobe pneumonia/atelectasis Acute hypoxic respiratory failure Sepsis History of basal cell carcinoma Acute on chronic renal failure CKD stage IIIb Normocytic anemia Deconditioning Anorexia Weight loss CVA Hyperkalemia   Pulmonary problem list  Acute hypoxic respiratory failure secondary to large loculated right pleural effusion with associated atelectasis versus pneumonia Certainly at risk for aspiration but also has a history of basal cell carcinoma so cancer a consideration  Plan Agree with empiric antibiotics Send cultures  Supplemental oxygen Analgesia Will need chest tube placement, we will send fluid for infection inflammation and malignancy evaluation.  He received Xarelto  at 9:30 PM last night.  Will  hold further doses and plan for procedure tomorrow.  Best Practice (right click and Reselect all SmartList Selections daily)   Per primary team  Signature    Lonna Coder MD [facility] See Amion for pager  If no response to pager , please call [phone] until 7pm After 7:00 pm call Elink  [phone] 06/25/2023, 1:19 PM

## 2023-06-25 NOTE — Care Management Obs Status (Signed)
 MEDICARE OBSERVATION STATUS NOTIFICATION   Patient Details  Name: Antonio [name]. MRN: [id number] Date of Birth: [birth year]   Medicare Observation Status Notification Given:  Yes    Ronni Colace, RN 06/25/2023, 9:55 AM

## 2023-06-25 NOTE — ED Notes (Addendum)
 Patient remains hypotensive 84/61, 90/74 HR tachycardic 110's with occasional non-sustained V-tach w pulse This RN messaged admitting team, MD Tobie responded promptly. He is aware of all, and ordered one time 500 mL bolus; no other orders at this time. Patient remains cardiac monitored.

## 2023-06-25 NOTE — ED Notes (Signed)
 Pt transported to [facility].

## 2023-06-25 NOTE — Progress Notes (Signed)
 Pharmacy Antibiotic Note  Antonio [name]. is a 83 y.o. male admitted on 06/24/2023 with  pleural effusion .  Pharmacy has been consulted for Vancomycin /Zosyn  dosing. WBC is elevated. Renal dysfunction is noted and accounted for in dosing calculations.    Plan: Vancomycin  1250 mg IV q48h >>>Estimated AUC: 511 Zosyn  3.375G IV q8h to be infused over 4 hours Trend WBC, temp, renal function  F/U infectious work-up Drug levels as indicated   Temp (24hrs), Avg:98.6 F (37 C), Min:98.6 F (37 C), Max:98.7 F (37.1 C)  Recent Labs  Lab 06/24/23 1636 06/24/23 1638 06/24/23 1717 06/24/23 2126 06/24/23 2230  WBC 15.2*  --   --   --  21.0*  CREATININE  --  2.42*  --   --   --   LATICACIDVEN  --   --  1.7 2.0*  --     CrCl cannot be calculated (Unknown ideal weight.).    No Known Allergies  Antonio [name], PharmD, BCPS Clinical Pharmacist Phone: [phone]

## 2023-06-25 NOTE — ED Notes (Signed)
 Pt returned from Ultrasound.

## 2023-06-25 NOTE — Care Management (Addendum)
 Transition of Care [facility]) - Inpatient Brief Assessment   Patient Details  Name: Eulas Schweitzer. MRN: [id number] Date of Birth: 1940/06/18  [facility] CM/SW Contact:    Corean JAYSON Canary, RN Phone Number: 06/25/2023, 10:04 AM   Clinical Narrative:  Patient presented with chest pain. At home he lives with his wife and is mostly wheelchair bound. Spoke with his daughter at bedside. She states her mother walks with a walker herself and has a difficult time moving the patient. They have been trying to get Home health services through primary provider. Discussed with daughter who is MPOA. Home health is possibility, any private duty would be  out of pocket for lifting help pinthe morning and evening. She stated they would be happy to get a hoyer lift, this is ordered for delivery from [employer]. Discussed palliative for goals of care, as daughter [name] shares that they are finding it difficult to navigate  through multiple physicians for all his needs. Discussed with providers. They will do f45f for [facility].  They want a agency that will work with their insurance. Called Cheryl rose with [facility]. She accepted.for PT OT and social work. Their OT does the aide work.   TOC will follow for needs, recommendations,and transitions of care.   Transition of Care Asessment: Insurance and Status: Insurance coverage has been reviewed Patient has primary care physician: Yes Home environment has been reviewed: Yes lives with wife Prior level of function:: Bed to wheelchair Prior/Current Home Services: No current home services Social Drivers of Health Review: SDOH reviewed no interventions necessary Readmission risk has been reviewed: Yes Transition of care needs: transition of care needs identified, TOC will continue to follow

## 2023-06-25 NOTE — ED Notes (Addendum)
 MD Arden Beck due to patient restlessness, and appears that PRN medication is not controlling his pain; Patient has been readjusted in bed & offered comfort measures. MD states he will come see the patient shortly; no new orders at this time.

## 2023-06-26 ENCOUNTER — Inpatient Hospital Stay (HOSPITAL_COMMUNITY): Payer: Medicare Other

## 2023-06-26 ENCOUNTER — Encounter (HOSPITAL_COMMUNITY): Admission: EM | Disposition: E | Payer: Self-pay | Source: Home / Self Care | Attending: Internal Medicine

## 2023-06-26 DIAGNOSIS — Z7189 Other specified counseling: Secondary | ICD-10-CM | POA: Diagnosis not present

## 2023-06-26 DIAGNOSIS — J9601 Acute respiratory failure with hypoxia: Secondary | ICD-10-CM

## 2023-06-26 DIAGNOSIS — R6520 Severe sepsis without septic shock: Secondary | ICD-10-CM | POA: Diagnosis not present

## 2023-06-26 DIAGNOSIS — J90 Pleural effusion, not elsewhere classified: Secondary | ICD-10-CM | POA: Diagnosis not present

## 2023-06-26 DIAGNOSIS — I4891 Unspecified atrial fibrillation: Secondary | ICD-10-CM | POA: Diagnosis not present

## 2023-06-26 DIAGNOSIS — Z515 Encounter for palliative care: Secondary | ICD-10-CM | POA: Diagnosis not present

## 2023-06-26 DIAGNOSIS — N179 Acute kidney failure, unspecified: Secondary | ICD-10-CM

## 2023-06-26 DIAGNOSIS — A403 Sepsis due to Streptococcus pneumoniae: Secondary | ICD-10-CM

## 2023-06-26 LAB — CBC WITH DIFFERENTIAL/PLATELET
Abs Immature Granulocytes: 0.21 10*3/uL — ABNORMAL HIGH (ref 0.00–0.07)
Basophils Absolute: 0 10*3/uL (ref 0.0–0.1)
Basophils Relative: 0 %
Eosinophils Absolute: 0 10*3/uL (ref 0.0–0.5)
Eosinophils Relative: 0 %
HCT: 25.9 % — ABNORMAL LOW (ref 39.0–52.0)
Hemoglobin: 7.4 g/dL — ABNORMAL LOW (ref 13.0–17.0)
Immature Granulocytes: 1 %
Lymphocytes Relative: 4 %
Lymphs Abs: 0.9 10*3/uL (ref 0.7–4.0)
MCH: 24.1 pg — ABNORMAL LOW (ref 26.0–34.0)
MCHC: 28.6 g/dL — ABNORMAL LOW (ref 30.0–36.0)
MCV: 84.4 fL (ref 80.0–100.0)
Monocytes Absolute: 1.1 10*3/uL — ABNORMAL HIGH (ref 0.1–1.0)
Monocytes Relative: 5 %
Neutro Abs: 18.4 10*3/uL — ABNORMAL HIGH (ref 1.7–7.7)
Neutrophils Relative %: 90 %
Platelets: 362 10*3/uL (ref 150–400)
RBC: 3.07 MIL/uL — ABNORMAL LOW (ref 4.22–5.81)
RDW: 18.5 % — ABNORMAL HIGH (ref 11.5–15.5)
WBC: 20.6 10*3/uL — ABNORMAL HIGH (ref 4.0–10.5)
nRBC: 0 % (ref 0.0–0.2)

## 2023-06-26 LAB — GLUCOSE, CAPILLARY
Glucose-Capillary: 106 mg/dL — ABNORMAL HIGH (ref 70–99)
Glucose-Capillary: 85 mg/dL (ref 70–99)
Glucose-Capillary: 115 mg/dL — ABNORMAL HIGH (ref 70–99)

## 2023-06-26 LAB — BLOOD GAS, VENOUS
Acid-base deficit: 4.2 mmol/L — ABNORMAL HIGH (ref 0.0–2.0)
Acid-base deficit: 8.5 mmol/L — ABNORMAL HIGH (ref 0.0–2.0)
Bicarbonate: 22.6 mmol/L (ref 20.0–28.0)
Bicarbonate: 18.4 mmol/L — ABNORMAL LOW (ref 20.0–28.0)
O2 Saturation: 23.1 %
O2 Saturation: 39.1 %
Patient temperature: 37
Patient temperature: 37.2
pCO2, Ven: 47 mm[Hg] (ref 44–60)
pCO2, Ven: 44 mm[Hg] (ref 44–60)
pH, Ven: 7.29 (ref 7.25–7.43)
pH, Ven: 7.23 — ABNORMAL LOW (ref 7.25–7.43)
pO2, Ven: <31 mm[Hg] — CL (ref 32–45)
pO2, Ven: <31 mm[Hg] — CL (ref 32–45)

## 2023-06-26 LAB — LACTATE DEHYDROGENASE, PLEURAL OR PERITONEAL FLUID: LD, Fluid: UNDETERMINED U/L (ref 3–23)

## 2023-06-26 LAB — BODY FLUID CELL COUNT WITH DIFFERENTIAL
Eos, Fluid: 0 %
Lymphs, Fluid: 9 %
Monocyte-Macrophage-Serous Fluid: 23 % — ABNORMAL LOW (ref 50–90)
Neutrophil Count, Fluid: 68 % — ABNORMAL HIGH (ref 0–25)
Total Nucleated Cell Count, Fluid: 17 uL (ref 0–1000)

## 2023-06-26 LAB — RENAL FUNCTION PANEL
Albumin: 2.2 g/dL — ABNORMAL LOW (ref 3.5–5.0)
Anion gap: 20 — ABNORMAL HIGH (ref 5–15)
BUN: 63 mg/dL — ABNORMAL HIGH (ref 8–23)
CO2: 17 mmol/L — ABNORMAL LOW (ref 22–32)
Calcium: 9.1 mg/dL (ref 8.9–10.3)
Chloride: 101 mmol/L (ref 98–111)
Creatinine, Ser: 3.44 mg/dL — ABNORMAL HIGH (ref 0.61–1.24)
GFR, Estimated: 17 mL/min — ABNORMAL LOW (ref >60)
Glucose, Bld: 110 mg/dL — ABNORMAL HIGH (ref 70–99)
Phosphorus: 6.4 mg/dL — ABNORMAL HIGH (ref 2.5–4.6)
Potassium: 5.9 mmol/L — ABNORMAL HIGH (ref 3.5–5.1)
Sodium: 138 mmol/L (ref 135–145)

## 2023-06-26 LAB — VANCOMYCIN, RANDOM: Vancomycin Rm: 8 ug/mL

## 2023-06-26 LAB — GLUCOSE, PLEURAL OR PERITONEAL FLUID: Glucose, Fluid: 39 mg/dL

## 2023-06-26 LAB — LACTIC ACID, PLASMA
Lactic Acid, Venous: 3.7 mmol/L (ref 0.5–1.9)
Lactic Acid, Venous: 1.3 mmol/L (ref 0.5–1.9)

## 2023-06-26 LAB — PROTEIN, PLEURAL OR PERITONEAL FLUID: Total protein, fluid: <3 g/dL

## 2023-06-26 LAB — MAGNESIUM: Magnesium: 2.6 mg/dL — ABNORMAL HIGH (ref 1.7–2.4)

## 2023-06-26 LAB — MRSA NEXT GEN BY PCR, NASAL: MRSA by PCR Next Gen: NOT DETECTED

## 2023-06-26 SURGERY — CHEST TUBE INSERTION
Anesthesia: LOCAL | Laterality: Right

## 2023-06-26 MED ORDER — ACETAMINOPHEN 325 MG PO TABS
650.0000 mg | ORAL_TABLET | Freq: Four times a day (QID) | ORAL | Status: DC
  Administered 2023-06-26: 650 mg via ORAL
  Filled 2023-06-26: qty 2

## 2023-06-26 MED ORDER — MAGNESIUM SULFATE 2 GM/50ML IV SOLN
2.0000 g | Freq: Once | INTRAVENOUS | Status: AC
  Administered 2023-06-26: 2 g via INTRAVENOUS
  Filled 2023-06-26: qty 50

## 2023-06-26 MED ORDER — SODIUM ZIRCONIUM CYCLOSILICATE 10 G PO PACK: 10.0000 g | PACK | Freq: Once | ORAL | Status: DC

## 2023-06-26 MED ORDER — VANCOMYCIN HCL 1250 MG/250ML IV SOLN
1250.0000 mg | Freq: Once | INTRAVENOUS | Status: AC
  Administered 2023-06-26: 1250 mg via INTRAVENOUS
  Filled 2023-06-26: qty 250

## 2023-06-26 MED ORDER — INSULIN ASPART 100 UNIT/ML IV SOLN
5.0000 [IU] | Freq: Once | INTRAVENOUS | Status: AC
  Administered 2023-06-26: 5 [IU] via INTRAVENOUS

## 2023-06-26 MED ORDER — VANCOMYCIN VARIABLE DOSE PER UNSTABLE RENAL FUNCTION (PHARMACIST DOSING): Status: DC

## 2023-06-26 MED ORDER — LACTATED RINGERS IV SOLN: INTRAVENOUS | Status: DC

## 2023-06-26 MED ORDER — TRAZODONE HCL 50 MG PO TABS
50.0000 mg | ORAL_TABLET | Freq: Every day | ORAL | Status: DC
  Administered 2023-06-26: 50 mg via ORAL
  Filled 2023-06-26: qty 1

## 2023-06-26 MED ORDER — GABAPENTIN 250 MG/5ML PO SOLN
300.0000 mg | Freq: Two times a day (BID) | ORAL | Status: DC
  Administered 2023-06-26 – 2023-06-27 (×2): 300 mg
  Filled 2023-06-26 (×3): qty 6

## 2023-06-26 MED ORDER — LACTATED RINGERS IV BOLUS
1000.0000 mL | Freq: Once | INTRAVENOUS | Status: AC
  Administered 2023-06-26: 1000 mL via INTRAVENOUS

## 2023-06-26 MED ORDER — ACETAMINOPHEN 650 MG RE SUPP: 650.0000 mg | Freq: Four times a day (QID) | RECTAL | Status: DC

## 2023-06-26 MED ORDER — ACETAMINOPHEN 325 MG PO TABS
650.0000 mg | ORAL_TABLET | Freq: Four times a day (QID) | ORAL | Status: DC
  Administered 2023-06-26 – 2023-06-27 (×3): 650 mg
  Filled 2023-06-26 (×3): qty 2

## 2023-06-26 MED ORDER — DEXTROSE 50 % IV SOLN: 1.0000 | Freq: Four times a day (QID) | INTRAVENOUS | Status: DC | PRN

## 2023-06-26 MED ORDER — TRAZODONE HCL 50 MG PO TABS
50.0000 mg | ORAL_TABLET | Freq: Every day | ORAL | Status: DC
  Administered 2023-06-26: 50 mg

## 2023-06-26 MED ORDER — SODIUM CHLORIDE 0.9 % IV SOLN: 250.0000 mL | INTRAVENOUS | Status: DC

## 2023-06-26 MED ORDER — NOREPINEPHRINE 4 MG/250ML-% IV SOLN
2.0000 ug/min | INTRAVENOUS | Status: DC
  Administered 2023-06-26: 2 ug/min via INTRAVENOUS
  Administered 2023-06-27: 5 ug/min via INTRAVENOUS
  Filled 2023-06-26: qty 250

## 2023-06-26 MED ORDER — OSMOLITE 1.5 CAL PO LIQD
1000.0000 mL | ORAL | Status: DC
  Administered 2023-06-26: 1000 mL

## 2023-06-26 MED ORDER — CALCIUM GLUCONATE-NACL 1-0.675 GM/50ML-% IV SOLN
1.0000 g | Freq: Once | INTRAVENOUS | Status: AC
  Administered 2023-06-26: 1000 mg via INTRAVENOUS
  Filled 2023-06-26: qty 50

## 2023-06-26 MED ORDER — PROSOURCE TF20 ENFIT COMPATIBL EN LIQD
60.0000 mL | Freq: Two times a day (BID) | ENTERAL | Status: DC
  Administered 2023-06-26 – 2023-06-27 (×2): 60 mL
  Filled 2023-06-26 (×2): qty 60

## 2023-06-26 MED ORDER — NOREPINEPHRINE 4 MG/250ML-% IV SOLN
INTRAVENOUS | Status: AC
  Filled 2023-06-26: qty 250

## 2023-06-26 MED ORDER — GABAPENTIN 300 MG PO CAPS
300.0000 mg | ORAL_CAPSULE | Freq: Two times a day (BID) | ORAL | Status: DC
  Administered 2023-06-26: 300 mg via ORAL
  Filled 2023-06-26: qty 1

## 2023-06-26 MED ORDER — LACTATED RINGERS IV BOLUS
500.0000 mL | Freq: Once | INTRAVENOUS | Status: AC
  Administered 2023-06-26: 500 mL via INTRAVENOUS

## 2023-06-26 MED ORDER — INSULIN GLARGINE-YFGN 100 UNIT/ML ~~LOC~~ SOLN
10.0000 [IU] | Freq: Once | SUBCUTANEOUS | Status: DC
  Filled 2023-06-26: qty 0.1

## 2023-06-26 MED ORDER — CHLORHEXIDINE GLUCONATE CLOTH 2 % EX PADS
6.0000 | MEDICATED_PAD | Freq: Every day | CUTANEOUS | Status: DC
Start: 2023-06-26 — End: 2023-06-27
  Administered 2023-06-26 – 2023-06-27 (×2): 6 via TOPICAL

## 2023-06-26 MED ORDER — DEXTROSE 50 % IV SOLN
1.0000 | Freq: Once | INTRAVENOUS | Status: AC
  Administered 2023-06-26: 50 mL via INTRAVENOUS
  Filled 2023-06-26: qty 50

## 2023-06-26 MED ORDER — THIAMINE MONONITRATE 100 MG PO TABS
100.0000 mg | ORAL_TABLET | Freq: Every day | ORAL | Status: DC
  Administered 2023-06-26 – 2023-06-27 (×2): 100 mg
  Filled 2023-06-26 (×2): qty 1

## 2023-06-26 MED ORDER — PIPERACILLIN-TAZOBACTAM IN DEX 2-0.25 GM/50ML IV SOLN
2.2500 g | Freq: Four times a day (QID) | INTRAVENOUS | Status: DC
Start: 2023-06-26 — End: 2023-06-27
  Administered 2023-06-26 – 2023-06-27 (×3): 2.25 g via INTRAVENOUS
  Filled 2023-06-26 (×6): qty 50

## 2023-06-26 MED ORDER — OXYCODONE HCL 5 MG PO TABS
5.0000 mg | ORAL_TABLET | Freq: Three times a day (TID) | ORAL | Status: DC
  Administered 2023-06-26: 5 mg
  Filled 2023-06-26: qty 1

## 2023-06-26 NOTE — Progress Notes (Signed)
 Initial Nutrition Assessment  DOCUMENTATION CODES:   Not applicable  INTERVENTION:   Initiate tube feeding via Cortrak tube: Osmolite 1.5 at 20 ml/h and increase by 10 ml every 8 hours to goal rate of 50 ml/hr (1200 ml per day)  Prosource TF20 60 ml BID  Provides 1960 kcal, 115 gm protein, 912 ml free water daily  Monitor magnesium  and phosphorus every 12 hours x 4 occurrences starting 06/30/2023, MD to replete as needed.   100 mg thiamine  daily x 7 days   NUTRITION DIAGNOSIS:   Inadequate oral intake related to inability to eat as evidenced by NPO status.  GOAL:   Patient will meet greater than or equal to 90% of their needs  MONITOR:   TF tolerance, Diet advancement  REASON FOR ASSESSMENT:   Consult Enteral/tube feeding initiation and management  ASSESSMENT:   Pt with PMH of Afib on chronic anticoagulation, stroke with residual left-sided weakness, dysarthria, basal cell carcinoma of the nose, HTN, HLD, stage III CKD, neuropathy with tremor and GERD admitted 2/9 with acute respiratory failure due to large R pleural effusion, RL and MLL PNA, sepsis, AKI on CKD, anorexia, deconditioning, and weight loss.   Palliative now following, pt is now a DNR but family ok with all other measures.  Pt failed swallow eval today, later tx to ICU and chest tube placed, plan for cortrak tube placement today.   2/10 - tx to ICU, chest tube insertion, Cortrak tube to be placed   Medications reviewed and include: protonix , lokelma  x 1 Levophed  @ 2 mcg   Labs reviewed:  K 5.9 Phos 6.4 CBG 87   Diet Order:   Diet Order             Diet NPO time specified  Diet effective now                   EDUCATION NEEDS:   Not appropriate for education at this time  Skin:  Skin Assessment: Reviewed RN Assessment  Last BM:  unknown  Height:   Ht Readings from Last 1 Encounters:  01/13/23 5\' 7"  (1.702 m)    Weight:   Wt Readings from Last 1 Encounters:  06/26/23 75.8 kg    BMI:  Body mass index is 26.17 kg/m.  Estimated Nutritional Needs:   Kcal:  1800-2000  Protein:  100-120 grams  Fluid:  >1.8 L/day  Randine Butcher., RD, LDN, CNSC See AMiON for contact information

## 2023-06-26 NOTE — ED Notes (Signed)
 Pt appears relaxed and is resting.

## 2023-06-26 NOTE — Progress Notes (Signed)
 PT Cancellation Note  Patient Details Name: Antonio [name]. MRN: [id number] DOB: Jun 30, 1940   Cancelled Treatment:    Reason Eval/Treat Not Completed: Patient not medically ready (Pt to transfer to ICU, plan for CT placement. Unclear baseline/PLOF and will follow PMT discussion with family for GOC. Will follow up at later date/time as schedule allows and pt able.)   Corbin Dess PT, DPT Acute Rehabilitation Services Office [phone]  06/26/23 9:58 AM

## 2023-06-26 NOTE — Progress Notes (Signed)
 PCCM note  Patient much more alert today evening. Urine output is low Will bolus with LR at 500 cc and start LR infusion at 100 cc/hour Follow lactic acid.  Renika Shiflet MD  Pulmonary & Critical care See Amion for pager  If no response to pager , please call [phone] until 7pm After 7:00 pm call Elink  [phone] 06/26/2023, 6:38 PM

## 2023-06-26 NOTE — Progress Notes (Signed)
 Pharmacy Antibiotic Note  Indi Molinelli. is a 83 y.o. male admitted on 06/24/2023 with  pleural effusion .  Pharmacy has been consulted for Vancomycin /Zosyn  dosing.   Vancomycin  random this evening subtherapeutic at 8mcg/ml ~48h after last dose of 1000mg . Cr up with minimal UOP but lactic acid cleared and NE off.  Plan: Vancomycin  1250mg  IV x1 - will dose via levels for now Continue Zosyn  2.25g IV q6h   Temp (24hrs), Avg:99 F (37.2 C), Min:98.5 F (36.9 C), Max:99.4 F (37.4 C)  Recent Labs  Lab 06/24/23 1636 06/24/23 1638 06/24/23 1717 06/24/23 2126 06/24/23 2230 06/25/23 0038 06/25/23 0346 06/25/23 1130 06/25/23 2233 06/26/23 0540 06/26/23 1950  WBC 15.2*  --   --   --  21.0*  --  18.6*  --   --  20.6*  --   CREATININE  --  2.42*  --   --   --   --  2.63* 2.88*  --  3.44*  --   LATICACIDVEN  --   --    < > 2.0*  --  0.8  --   --  1.7 3.7* 1.3  VANCORANDOM  --   --   --   --   --   --   --   --   --   --  8   < > = values in this interval not displayed.    Estimated Creatinine Clearance: 15.2 mL/min (A) (by C-G formula based on SCr of 3.44 mg/dL (H)).    No Known Allergies  Vanc 2/8>> Zosyn  2/9>>   2/8 BCx - NGTD 2/10 fluid -   Levin Reamer, PharmD, BCPS, [facility] Clinical Pharmacist [phone] Please check AMION for all [facility] Pharm[facility] numbers 06/26/2023

## 2023-06-26 NOTE — ED Notes (Signed)
 RN went in to assess the patient. He is verbal and able to communicate his needs. Niece is at the bedside and thinks he can take meds PO with applesauce. RN tried a small bite of applesauce and pt had no issues. Pills given and pt was able to swallow without difficulty. RN asked him is he was in pain and wanted pain meds. He states "yes I would. I hurt in my legs". RN and niece repositioned him with pillows on to his left side. Pt voiced gratitude. Will continue to check on him.

## 2023-06-26 NOTE — Progress Notes (Addendum)
 Subjective: Antonio [name]. Is a 83 year old male with a history of atrial fibrillation on anticoagulation, prior stroke with residual left-sided weakness and dysarthria, hypertension, and CKD stage 3b who presented with acute chest pain and is admitted for acute hypoxic respiratory failure and sepsis.   Hypotensive overnight, started on fluids.  Today, patient is laying in bed with niece at bedside.   Objective:  Vital signs in last 24 hours: Vitals:   06/26/23 0200 06/26/23 0230 06/26/23 0530 06/26/23 0600  BP: 100/64 120/88 (!) 112/58 99/80  Pulse: (!) 116 (!) 114 (!) 115 (!) 114  Resp: 17 (!) 23 (!) 22 (!) 27  Temp:      TempSrc:      SpO2: 97% 97% (!) 77% 92%   Physical Exam: General:Ill appearing, appears toxic  Cardiac:tachycardic, no murmur appreciated   Pulmonary: tachypnea, on 1L supplemental O2,  Abdominal: soft, no evident pain response to palpation Neuro: LUE tremor noted on exam  MSK:No pitting edema appreciated  Skin:warm and dry Psych: unable to assess      Latest Ref Rng & Units 06/26/2023    5:40 AM 06/25/2023    3:46 AM 06/24/2023   10:41 PM  CBC  WBC 4.0 - 10.5 K/uL 20.6  18.6    Hemoglobin 13.0 - 17.0 g/dL 7.4  7.4  8.2   Hematocrit 39.0 - 52.0 % 25.9  25.3  24.0   Platelets 150 - 400 K/uL 362  329         Latest Ref Rng & Units 06/25/2023   11:30 AM 06/25/2023    3:46 AM 06/24/2023   10:41 PM  BMP  Glucose 70 - 99 mg/dL 92  409    BUN 8 - 23 mg/dL 61  59    Creatinine 8.11 - 1.24 mg/dL 9.14  7.82    Sodium 956 - 145 mmol/L 135  136  138   Potassium 3.5 - 5.1 mmol/L 5.3  5.4  4.8   Chloride 98 - 111 mmol/L 106  106    CO2 22 - 32 mmol/L 16  20    Calcium  8.9 - 10.3 mg/dL 8.2  8.1      Echo results: -LVEF 55 -small pericardial effusion --> pericarditis with the CP? -Aortic dilatation root 38mm   Assessment/Plan:  Principal Problem:   Acute hypoxic respiratory failure (HCC) Active Problems:   PAF (paroxysmal atrial fibrillation) (HCC)    Pleural effusion   Sepsis (HCC)   AKI (acute kidney injury) (HCC)   Pneumonia due to infectious organism   Palliative care by specialist   Goals of care, counseling/discussion   Acute hypoxic respiratory failure  Large loculated R Pleural effusion Sepsis 2/2 suspected R middle and R lower pneumonia on CT imaging  PCCM was contacted this morning due to patient's decline. He was hypotensive, tachycardic, tachypnea, with worsening renal function, leukocytosis of 20.6 and a lactic acid of 3.7 this morning. His sepsis is progressing while on vancomycin  and zosyn . PCCM have assumed primary care.  Plan: -Patient transferred to the ICU -Family in room was updated on patient's condition and ICU transfer  -Blood culture: NGTD  -s/p LR bolus 1L  AKI, oliguria  Hyperkalemia 2/2 sepsis. Serum creatine increased from 2.88 to 3.44 and hyperkalemia at 5.9. Bladder scan did not reveal urinary retention.  Plan: -patient was transferred to the ICU for further management, s/p 1L LR bolus  -s/p 5 units novolog  for hyperkalemia  Normocytic anemia, acute on chronic  Possibly due to sepsis. Patient's Hgb remained stable at 7.4.  -CBC daily   PAF Holding home xarelto  for chest tube insertion   Decreased appetite Deconditioning Goals of Care - SLP evaluation: Nectar thick liquid, DYS-1  - Palliative consult - PT/OT  Essential hypertension Holding home regimen due to hypotension 2/2 sepsis.    Chronic insomnia Stable. Continue trazodone     Chronic lower back pain Neuropathy Continue home Gabapentin  300 mg twice daily. Fentanyl  50mcg q2 hours prn    Resting tremor Left-sided resting tremor, chronic.  Has declined neurology referral for further evaluation per PCP.  Unclear etiology.    Resolved Problems:  __________________________________  Code Status: DNR/DNI VTE Prophylaxis: Diet:DYS 1 Barriers to Discharge:Medical treatment in the ICU Dispo: Anticipated discharge in approximately 2  day(s).   Aurora Lees, DO 06/26/2023, 6:20 AM Pager: [phone] After 5pm on weekdays and 1pm on weekends: On Call pager [phone]

## 2023-06-26 NOTE — Progress Notes (Signed)
 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     The Palliative Medicine team has been asked to get involved to further support goals of care conversations.  Today's Discussion 06/26/2023  *Please note that this is a verbal dictation therefore any spelling or grammatical errors are due to the "Dragon Medical One" system interpretation.  Chart reviewed inclusive of vital signs, progress notes, laboratory results, and diagnostic images. Labs indicative of worsening sepsis.   I met with Antonio [name] while he was still in the ED this morning. He is noted to be somnolent. He does respond when spoken to though his speech is hard to understand.   I met with patients grandson and granddaughter. We reviewed the plan for Antonio [name] to go to the ICU.  I shared I would be back later in the day to speak more to patients daughter. _______________  I met this afternoon with Antonio [name]. We reviewed the concerns in the setting of Antonio [name]' severe sepsis. I shared the worry for long term outcomes due to patients profound deconditioning and adult failure to thrive. We discussed how trying an acute hospitalization such as this one can have on patients.  Antonio [name] is aware that Antonio [name] has multiple organ systems which are impaired. We talked about indicators of improvements and worsening.   We reviewed if Tadao neglects to improve where to go from in regards to options. We talked about a continued aggressive path versus one of comfort.   Created space and opportunity for patients daughter to explore thoughts feelings and fears regarding her fathers current medical situation. Antonio [name] shares  understanding as to how tenuous Antonio [name] is at this time. She is thankful for the additional conversation(s) and feel that she has a better idea of what to anticipate moving forward.   Questions and concerns addressed/Palliative Support Provided.   Objective Assessment: Vital Signs Vitals:   06/26/23 1300 06/26/23 1315  BP: (!) 90/56 93/68  Pulse: (!) 111 (!) 112  Resp: 16 17  Temp:    SpO2: 100% 100%    Intake/Output Summary (Last 24 hours) at 06/26/2023 1412 Last data filed at 06/26/2023 1300 Gross per 24 hour  Intake 640.15 ml  Output 610 ml  Net 30.15 ml   Last Weight  Most recent update: 06/26/2023  2:08 PM    Weight  75.8 kg (167 lb 1.7 oz)            Gen:  Elderly Caucasian M in moderate distress HEENT: dry mucous membranes CV: Irregular rate and rhythm  PULM:  On 2LPM Antonio [name], breathing is even and nonlabored ABD: soft/nontender  EXT: No edema  Neuro: Somnolent  SUMMARY OF RECOMMENDATIONS   DNAR/DNI --> Have provided and reviewed a MOST form for completion   Open and honest conversations held in the setting of patients present health and possibility of further deterioration  Plan to allow time for outcomes   Ongoing PMT support  Billing based on MDM: High ______________________________________________________________________________________ Camille [facility] Palliative Medicine Team Team Cell Phone: [phone] Please utilize secure chat with additional questions, if there is no response within 30 minutes please call the above phone number  Palliative Medicine Team providers are available by phone from 7am to 7pm  daily and can be reached through the team cell phone.  Should this patient require assistance outside of these hours, please call the patient's attending physician.

## 2023-06-26 NOTE — Procedures (Signed)
 Insertion of Chest Tube Procedure Note  Mang Tailor  [id number]  03/30/1941  Date:06/26/23  Time:11:50 AM    Provider Performing: Phyllis Breeze   Procedure: Pleural Catheter Insertion w/ Imaging Guidance (40981)  Indication(s) Effusion  Consent Risks of the procedure as well as the alternatives and risks of each were explained to the patient and/or caregiver.  Consent for the procedure was obtained and is signed in the bedside chart  Anesthesia Topical only with 1% lidocaine     Time Out Verified patient identification, verified procedure, site/side was marked, verified correct patient position, special equipment/implants available, medications/allergies/relevant history reviewed, required imaging and test results available.   Sterile Technique Maximal sterile technique including full sterile barrier drape, hand hygiene, sterile gown, sterile gloves, mask, hair covering, sterile ultrasound probe cover (if used).   Procedure Description Ultrasound used to identify appropriate pleural anatomy for placement and overlying skin marked. Area of placement cleaned and draped in sterile fashion.  A pigtail pleural catheter was placed into the right pleural space using Seldinger technique. Appropriate return of blood was obtained.  The tube was connected to atrium and placed on -20 cm H2O wall suction.   Complications/Tolerance None; patient tolerated the procedure well. Chest X-ray is ordered to verify placement.   EBL Minimal  Specimen(s) fluid  Phyllis Breeze MD [place] Pulmonary & Critical care See Amion for pager  If no response to pager , please call [phone] until 7pm After 7:00 pm call Elink  [phone] 06/26/2023, 11:50 AM

## 2023-06-26 NOTE — Progress Notes (Addendum)
 NAME:  Antonio [name], Antonio [name] MRN:  [id number], DOB:  Apr 24, 1941, LOS: 0 ADMISSION DATE:  06/24/2023, CONSULTATION DATE: 06/25/2023 REFERRING MD: Joe Murders, DO, CHIEF COMPLAINT: Pleural effusion  History of Present Illness:   83 year old male patient with multiple comorbidities as listed below.  Presented to the emergency room On 2/8 with chief complaint of generalized chest pressure radiating to shoulders and back, no nausea vomiting chills fever or abdominal pain has had chronic issue with anorexia.  Seen in the emergency room placed on supplemental oxygen initially afebrile tachycardic, A-fib with RVR profoundly weak initial white blood cell count 15.2 respiratory viral panel was negative, creatinine 2.42 up from baseline of around 2 HCO3 19 glucose 104 INR 1.6, lactate 1.7 procalcitonin 0.14, initial troponin 8, BNP 267.  ECG showed sinus tachycardia with first-degree heart block and multiple PVCs, CT of chest pelvis and abdomen showed a large partially loculated right pleural effusion with extensive right middle lobe and right lower lobe consolidation there was a small free-flowing left effusion had some mild mediastinal adenopathy abdomen was negative he did have a mildly enlarged prostate.  CT of head was negative. He was placed on supplemental oxygen, placed on broad-spectrum antibiotics for loculated effusion and right lower lobe middle lobe consolidation Pulmonary asked to evaluate for loculated effusion.  Pertinent  Medical History  Atrial fibrillation on chronic anticoagulation prior stroke with residual left-sided weakness, dysarthria, basal cell carcinoma of the nose, hypertension, hyperlipidemia, stage IIIb CKD, neuropathy with tremor on gabapentin , GERD  Significant Hospital Events: Including procedures, antibiotic start and stop dates in addition to other pertinent events   2/9 admit  Interim History / Subjective:   Hypotensive overnight, tachycardic, and tachypneic, his lactate is  now 3.7. K remains elevated, BUN increasing as well.   Objective   Blood pressure 99/80, pulse (!) 114, temperature 99.4 F (37.4 C), temperature source Axillary, resp. rate (!) 27, SpO2 92%.        Intake/Output Summary (Last 24 hours) at 06/26/2023 0748 Last data filed at 06/25/2023 2056 Gross per 24 hour  Intake 550.63 ml  Output --  Net 550.63 ml   There were no vitals filed for this visit.  Examination: Gen:      No acute distress, chronically ill appearing HEENT:  EOMI, sclera anicteric Neck:     No masses; no thyromegaly Lungs:    Clear to auscultation bilaterally; normal respiratory effort CV:         Regular rate and rhythm; no murmurs Abd:      + bowel sounds; soft, non-tender; no palpable masses, no distension Ext:    No edema; adequate peripheral perfusion Skin:      Warm and dry; no rash Neuro: Somnolent arousable, lt hand tremor  Lab/imaging reviewed Potassium 5.9, BUN/creatinine 63/3.44 WBC 20.6, hemoglobin 7.4, platelet 362 Lactic acid 3.7  CT chest, abdomen pelvis reviewed with large partially loculated right effusion with right middle lobe and lower lobe consolidation small left effusion I have reviewed the images personally.  Resolved Hospital Problem list     Assessment & Plan:  Large loculated right pleural effusion Right lower and middle lobe pneumonia/atelectasis Acute hypoxic respiratory failure Sepsis History of basal cell carcinoma Acute on chronic renal failure CKD stage IIIb Normocytic anemia Deconditioning Anorexia Weight loss CVA Hyperkalemia   Acute hypoxic respiratory failure secondary to large loculated right pleural effusion with associated atelectasis versus pneumonia Certainly at risk for aspiration but also has a history of basal cell carcinoma so  cancer a consideration  Plan Agree with empiric antibiotics.  Broad coverage with Vanco, Zosyn  Transfer to ICU for closer monitoring as he appears more septic Will plan on chest  tube placement Send cultures Supplemental oxygen Analgesia Give additional IV fluids, follow lactic acid  AKI secondary to sepsis on chronic kidney disease stage IIIb Hypokalemia Insulin , dextrose , bicarb Follow urine output and creatinine  Paroxysmal atrial fibrillation Telemetry monitoring Holding home Xarelto  for chest tube insertion  Goals of care Family wants DNR but are okay with all other measures.  Palliative care has been consulted by attending team.  Best Practice (right click and "Reselect all SmartList Selections" daily)   Diet/type: NPO DVT prophylaxis SCD Pressure ulcer(s): N/A GI prophylaxis: N/A Lines: N/A Foley:  N/A Code Status:  DNR Last date of multidisciplinary goals of care discussion []   Signature    The patient is critically ill with multiple organ system failure and requires high complexity decision making for assessment and support, frequent evaluation and titration of therapies, advanced monitoring, review of radiographic studies and interpretation of complex data.   Critical Care Time devoted to patient care services, exclusive of separately billable procedures, described in this note is 35 minutes.   Raquon Milledge MD [place] [facility] & Critical [facility] See Amion for pager  If no response to pager , please call [phone] until 7pm After 7:00 pm call Elink  [phone] 06/26/2023, 10:26 AM   .pccmp

## 2023-06-26 NOTE — ED Notes (Signed)
 Pt is getting agitated. Pulled out IV, removed leads, and gown. Pt repositioned in the bed.  Cardiac leads replaced. Fan given to blow on his face.

## 2023-06-26 NOTE — Procedures (Signed)
 Cortrak  Person Inserting Tube:  Edwena Graham D, RD Tube Type:  Cortrak - 43 inches Tube Size:  10 Tube Location:  Left nare Secured by: Bridle Technique Used to Measure Tube Placement:  Marking at nare/corner of mouth Cortrak Secured At:  80 cm Procedure Comments:  Cortrak Tube Team Note:  Consult received to place a Cortrak feeding tube.   X-ray is required, abdominal x-ray has been ordered by the Cortrak team. Please confirm tube placement before using the Cortrak tube.   If the tube becomes dislodged please keep the tube and contact the Cortrak team at [email or web address] for replacement.  If after hours and replacement cannot be delayed, place a NG tube and confirm placement with an abdominal x-ray.    Edwena Graham, RD, LDN Registered Dietitian II Please reach out via secure chat Weekend on-call pager # available in [facility]

## 2023-06-26 NOTE — Procedures (Deleted)
 Thoracentesis  Procedure Note  Antonio [name]  [id number]  [birth year]  Date:06/26/23  Time:11:48 AM   Provider Performing:Taniah Reinecke   Procedure: Thoracentesis with imaging guidance (40981)  Indication(s) Pleural Effusion  Consent Risks of the procedure as well as the alternatives and risks of each were explained to the patient and/or caregiver.  Consent for the procedure was obtained and is signed in the bedside chart  Anesthesia Topical only with 1% lidocaine     Time Out Verified patient identification, verified procedure, site/side was marked, verified correct patient position, special equipment/implants available, medications/allergies/relevant history reviewed, required imaging and test results available.   Sterile Technique Maximal sterile technique including full sterile barrier drape, hand hygiene, sterile gown, sterile gloves, mask, hair covering, sterile ultrasound probe cover (if used).  Procedure Description Ultrasound was used to identify appropriate pleural anatomy for placement and overlying skin marked.  Area of drainage cleaned and draped in sterile fashion. Lidocaine  was used to anesthetize the skin and subcutaneous tissue.  200 cc's of bloody appearing fluid was drained from the right pleural space. Catheter then removed and bandaid applied to site.   Complications/Tolerance None; patient tolerated the procedure well. Chest X-ray is ordered to confirm no post-procedural complication.   EBL Minimal   Specimen(s) Pleural fluid  Phyllis Breeze MD [facility] See Amion for pager  If no response to pager , please call [phone] until 7pm After 7:00 pm call Elink  [phone] 06/26/2023, 11:49 AM

## 2023-06-26 NOTE — ED Notes (Signed)
 PT is resting better at this time; will continue to monitor

## 2023-06-26 NOTE — Progress Notes (Signed)
 Speech Language Pathology Treatment: Dysphagia  Patient Details Name: Antonio [name]. MRN: [id number] DOB: 01-17-1941 Today's Date: 06/26/2023 Time: 5784-6962 SLP Time Calculation (min) (ACUTE ONLY): 11 min  Assessment / Plan / Recommendation Clinical Impression  F/u after yesterday's clinical swallowing assessment. Granddaughter at the bedside.  Pt alert, dysarthric, not consistently following commands.  Preparing for transfer to the ICU.  Accepted bites of applesauce with adequate attention; nectar thick liquid induced immediate coughing over several trials.  Concerned for airway protection at this time given overall medical condition and mental status.  Recommend NPO for now- except meds given whole in puree.  SLP will follow closely. D/W granddaughter.   HPI HPI: Patient is an 83 y.o. 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 He presented to the hospital on 06/24/23 for evaluation of chest pain. He was found to be septic with a right sided loculated parapneumonic effusion. Spouse reported he does frequently cough when eating at baseline. SLP swallow evaluation ordered secondary to concern for aspiration.      SLP Plan  Continue with current plan of care      Recommendations for follow up therapy are one component of a multi-disciplinary discharge planning process, led by the attending physician.  Recommendations may be updated based on patient status, additional functional criteria and insurance authorization.    Recommendations  Diet recommendations: NPO Medication Administration: Whole meds with puree                  Oral care QID   Frequent or constant Supervision/Assistance       Continue with current plan of care    Azalynn Maxim L. Beatris Lincoln, MA CCC/SLP Clinical Specialist - Acute Care SLP Acute Rehabilitation Services Office number [phone]   Myna Asal Laurice  06/26/2023, 10:05 AM

## 2023-06-26 NOTE — ED Notes (Signed)
 MIttens applied as pt took off gown and leads again.

## 2023-06-26 NOTE — Progress Notes (Signed)
 Pharmacy Antibiotic Note  Atreus Jepson. is a 83 y.o. male admitted on 06/24/2023 with  pleural effusion .  Pharmacy has been consulted for Vancomycin /Zosyn  dosing.   Renal function worsening - SCr up to 3.44, CrCL < 20 ml/min, WBC 20.6, lactate up 3.7, no UOP charted.  Plan: No standing vanc - VR this PM Reduce Zosyn  to 2.25gm IV Q6H  Monitor renal fxn, clinical progress   Temp (24hrs), Avg:98.6 F (37 C), Min:97.9 F (36.6 C), Max:99.4 F (37.4 C)  Recent Labs  Lab 06/24/23 1636 06/24/23 1638 06/24/23 1717 06/24/23 2126 06/24/23 2230 06/25/23 0038 06/25/23 0346 06/25/23 1130 06/25/23 2233 06/26/23 0540  WBC 15.2*  --   --   --  21.0*  --  18.6*  --   --  20.6*  CREATININE  --  2.42*  --   --   --   --  2.63* 2.88*  --  3.44*  LATICACIDVEN  --   --  1.7 2.0*  --  0.8  --   --  1.7 3.7*    Estimated Creatinine Clearance: 15.2 mL/min (A) (by C-G formula based on SCr of 3.44 mg/dL (H)).    No Known Allergies  Vanc 2/8>> Zosyn  2/9>>   2/8 BCx - NGTD 2/10 fluid -   Estrella Alcaraz D. Marikay Show, PharmD, BCPS, BCCCP 06/26/2023, 2:11 PM

## 2023-06-26 NOTE — Progress Notes (Signed)
 OT Cancellation Note  Patient Details Name: Antonio [name]. MRN: [id number] DOB: [birth year]   Cancelled Treatment:    Reason Eval/Treat Not Completed: Medical issues which prohibited therapy.  Pt transferred to ICU and with new chest tube placement.  Will hold OT eval until tomorrow based on medical change.    Ardena Becker, OTR/L Acute Rehabilitation Services  Office [phone] 06/26/2023

## 2023-06-27 ENCOUNTER — Inpatient Hospital Stay (HOSPITAL_COMMUNITY): Payer: Medicare Other

## 2023-06-27 DIAGNOSIS — J9601 Acute respiratory failure with hypoxia: Secondary | ICD-10-CM | POA: Diagnosis not present

## 2023-06-27 DIAGNOSIS — Z515 Encounter for palliative care: Secondary | ICD-10-CM | POA: Diagnosis not present

## 2023-06-27 DIAGNOSIS — Z7189 Other specified counseling: Secondary | ICD-10-CM | POA: Diagnosis not present

## 2023-06-27 DIAGNOSIS — N179 Acute kidney failure, unspecified: Secondary | ICD-10-CM | POA: Diagnosis not present

## 2023-06-27 DIAGNOSIS — J189 Pneumonia, unspecified organism: Secondary | ICD-10-CM | POA: Diagnosis not present

## 2023-06-27 LAB — CBC
HCT: 24.9 % — ABNORMAL LOW (ref 39.0–52.0)
Hemoglobin: 7.3 g/dL — ABNORMAL LOW (ref 13.0–17.0)
MCH: 24.3 pg — ABNORMAL LOW (ref 26.0–34.0)
MCHC: 29.3 g/dL — ABNORMAL LOW (ref 30.0–36.0)
MCV: 82.7 fL (ref 80.0–100.0)
Platelets: 350 10*3/uL (ref 150–400)
RBC: 3.01 MIL/uL — ABNORMAL LOW (ref 4.22–5.81)
RDW: 18.4 % — ABNORMAL HIGH (ref 11.5–15.5)
WBC: 15.5 10*3/uL — ABNORMAL HIGH (ref 4.0–10.5)
nRBC: 0.3 % — ABNORMAL HIGH (ref 0.0–0.2)

## 2023-06-27 LAB — MAGNESIUM: Magnesium: 2.9 mg/dL — ABNORMAL HIGH (ref 1.7–2.4)

## 2023-06-27 LAB — BASIC METABOLIC PANEL
Anion gap: 13 (ref 5–15)
BUN: 77 mg/dL — ABNORMAL HIGH (ref 8–23)
CO2: 18 mmol/L — ABNORMAL LOW (ref 22–32)
Calcium: 8.2 mg/dL — ABNORMAL LOW (ref 8.9–10.3)
Chloride: 105 mmol/L (ref 98–111)
Creatinine, Ser: 3.58 mg/dL — ABNORMAL HIGH (ref 0.61–1.24)
GFR, Estimated: 16 mL/min — ABNORMAL LOW (ref >60)
Glucose, Bld: 111 mg/dL — ABNORMAL HIGH (ref 70–99)
Potassium: 5.2 mmol/L — ABNORMAL HIGH (ref 3.5–5.1)
Sodium: 136 mmol/L (ref 135–145)

## 2023-06-27 LAB — GLUCOSE, CAPILLARY
Glucose-Capillary: 121 mg/dL — ABNORMAL HIGH (ref 70–99)
Glucose-Capillary: 103 mg/dL — ABNORMAL HIGH (ref 70–99)
Glucose-Capillary: 109 mg/dL — ABNORMAL HIGH (ref 70–99)

## 2023-06-27 LAB — LACTIC ACID, PLASMA: Lactic Acid, Venous: 1.2 mmol/L (ref 0.5–1.9)

## 2023-06-27 LAB — PHOSPHORUS: Phosphorus: 5.8 mg/dL — ABNORMAL HIGH (ref 2.5–4.6)

## 2023-06-27 MED ORDER — FENTANYL BOLUS VIA INFUSION
50.0000 ug | INTRAVENOUS | Status: DC | PRN
  Administered 2023-06-27: 50 ug via INTRAVENOUS

## 2023-06-27 MED ORDER — SODIUM CHLORIDE 0.9% FLUSH
10.0000 mL | Freq: Three times a day (TID) | INTRAVENOUS | Status: DC
  Administered 2023-06-27: 10 mL via INTRAPLEURAL

## 2023-06-27 MED ORDER — LORAZEPAM 2 MG/ML IJ SOLN
0.5000 mg | INTRAMUSCULAR | Status: DC | PRN
  Administered 2023-06-27: 1 mg via INTRAVENOUS
  Filled 2023-06-27: qty 1

## 2023-06-27 MED ORDER — POLYVINYL ALCOHOL 1.4 % OP SOLN: 1.0000 [drp] | Freq: Four times a day (QID) | OPHTHALMIC | Status: DC | PRN

## 2023-06-27 MED ORDER — LORAZEPAM 2 MG/ML IJ SOLN
1.0000 mg | Freq: Once | INTRAMUSCULAR | Status: AC
  Administered 2023-06-27: 1 mg via INTRAVENOUS
  Filled 2023-06-27: qty 1

## 2023-06-27 MED ORDER — ONDANSETRON HCL 4 MG/2ML IJ SOLN: 4.0000 mg | Freq: Four times a day (QID) | INTRAMUSCULAR | Status: DC | PRN

## 2023-06-27 MED ORDER — FENTANYL 2500MCG IN NS 250ML (10MCG/ML) PREMIX INFUSION
0.0000 ug/h | INTRAVENOUS | Status: DC
  Administered 2023-06-27: 200 ug/h via INTRAVENOUS
  Filled 2023-06-27: qty 250

## 2023-06-27 MED ORDER — BIOTENE DRY MOUTH MT LIQD: 15.0000 mL | OROMUCOSAL | Status: DC | PRN

## 2023-06-27 MED ORDER — GLYCOPYRROLATE 0.2 MG/ML IJ SOLN
0.4000 mg | INTRAMUSCULAR | Status: DC | PRN
  Administered 2023-06-27: 0.4 mg via INTRAVENOUS
  Filled 2023-06-27: qty 2

## 2023-06-27 MED ORDER — OXYCODONE HCL 5 MG PO TABS
5.0000 mg | ORAL_TABLET | Freq: Once | ORAL | Status: AC
  Administered 2023-06-27: 5 mg via ORAL
  Filled 2023-06-27: qty 1

## 2023-06-27 MED ORDER — ONDANSETRON 4 MG PO TBDP: 4.0000 mg | ORAL_TABLET | Freq: Four times a day (QID) | ORAL | Status: DC | PRN

## 2023-06-27 MED ORDER — SODIUM ZIRCONIUM CYCLOSILICATE 5 G PO PACK
5.0000 g | PACK | Freq: Once | ORAL | Status: AC
  Administered 2023-06-27: 5 g
  Filled 2023-06-27: qty 1

## 2023-06-28 LAB — CYTOLOGY - NON PAP

## 2023-06-29 LAB — CULTURE, BLOOD (ROUTINE X 2)
Culture: NO GROWTH
Culture: NO GROWTH

## 2023-06-30 LAB — BODY FLUID CULTURE W GRAM STAIN: Gram Stain: NONE SEEN

## 2023-07-04 ENCOUNTER — Ambulatory Visit: Payer: Medicare Other | Admitting: Oncology

## 2023-07-15 NOTE — Progress Notes (Signed)
NAME:  Antonio [name], Abercrombie MRN:  [id number], DOB:  [birth year], LOS: 0 ADMISSION DATE:  06/24/2023, CONSULTATION DATE: 06/25/2023 REFERRING MD: Harmon Dun, DO, CHIEF COMPLAINT: Pleural effusion  History of Present Illness:   83 year old male patient with multiple comorbidities as listed below.  Presented to the emergency room On 2/8 with chief complaint of generalized chest pressure radiating to shoulders and back, no nausea vomiting chills fever or abdominal pain has had chronic issue with anorexia.  Seen in the emergency room placed on supplemental oxygen initially afebrile tachycardic, A-fib with RVR profoundly weak initial white blood cell count 15.2 respiratory viral panel was negative, creatinine 2.42 up from baseline of around 2 HCO3 19 glucose 104 INR 1.6, lactate 1.7 procalcitonin 0.14, initial troponin 8, BNP 267.  ECG showed sinus tachycardia with first-degree heart block and multiple PVCs, CT of chest pelvis and abdomen showed a large partially loculated right pleural effusion with extensive right middle lobe and right lower lobe consolidation there was a small free-flowing left effusion had some mild mediastinal adenopathy abdomen was negative he did have a mildly enlarged prostate.  CT of head was negative. He was placed on supplemental oxygen, placed on broad-spectrum antibiotics for loculated effusion and right lower lobe middle lobe consolidation Pulmonary asked to evaluate for loculated effusion.  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 rate 12, weight 75.8 kg, SpO2 100%.        Intake/Output Summary (Last 24 hours) at 06/30/2023 1121 Last data filed at 07/01/2023 1000 Gross per 24 hour  Intake 3530.27 ml  Output 1390 ml  Net 2140.27 ml   Filed Weights   06/26/23 1010 06/26/23 1400  Weight: 85.9 kg 75.8 kg    Examination: General: acute on chronically ill appearing older adult male, lying in ICU bed HEENT: normocephalic, [place] in nares, poor dentition, dry MM, cortrak  Pulm: rhonchi, diminished throughout lung fields, tachypnea, no distress Right chest tube in place to suction-serosang output  CV: s1,s2, sinus tachy, no MRG, No JVD Abs: cortrak, soft, BS active Ext: 1+ BL lower extremity edema, generalized edema overall  Moves extremities to stimuli  Neuro: agitation, RASS 2, not following commands, PERRLA intact   Resolved Hospital Problem list     Assessment & Plan:  Septic shock with Multisystem organ failure suspect right lower/middle lobe pna vs loculated right pleural effusion CT chest, abdomen pelvis reviewed with large partially loculated right effusion with right middle lobe and lower lobe consolidation small left effusion Right chest tube placed on 2/10-serosang output P: Continue to monitor chest tube output, continue at 20 cm of suction  Continue to follow blood cultures Continue to trend WBC Continue vanc and zosyn at this time Continue GOC conversation, see below   Acute hypoxic respiratory failure Patient DNR/DNI P: Continue O2 Sat goal >92%, wean  O2 as tolerated  Continue GOC conversation with daughter Lafonda Mosses POA)  Acute on Chronic Renal Failure  CKD Stage  IIIb Hyperkalemia  Worsening renal function, despite fluid admin Diminished urine output  P: Continue to trend renal function and electrolytes per BMET Mag, phos daily  Treated initially for hyperkalemia wit insulin 5 units, d50, and lokelma  Continue lokelma per cortrak  Continue Ongoing GOC see below   Paroxysmal atrial fibrillation On eliquis  P: Continue to hold eliquis for now   HTN HLD  CVA hx Right side weakness, dysarthria P:  Hold antihypertensives for now since on levophed  Anorexia Deconditioning  P: Continue tube feeds per cortrak    GOC  Discussed with daughter (Diana-POA) via phone on morning of 2/11 in regards to patient's deteriorating condition despite aggressive interventions. Notified Lafonda Mosses that patient in multisystem organ failure (cardiovascular and renal failure) and appears that patient is experiencing end of life delirium as well. Lafonda Mosses expressed that her father would not want intubation/life support and or permanent feeding tube. Recommend to focus on quality of life and transitioning to comfort care.  Lafonda Mosses was going to discuss with mother, patient's wife about worsening status and comfort care recs P: Palliative following, appreciate input  Follow up with daughter in regards to transitioning to comfort    Best Practice (right click and "Reselect all SmartList Selections" daily)   Diet/type: NPO DVT prophylaxis SCD Pressure ulcer(s): N/A GI prophylaxis: N/A Lines: N/A Foley:  N/A Code Status:  DNR Last date of multidisciplinary goals of care discussion -updated POA via phone on 2/11   Signature   CC: 40 mins   Christian Carlinda Ohlson AGACNP-BC   [facility] 07/08/2023, 11:52 AM  Please see [email or web address] for pager details.  From 7A-7P if no response, please call [phone]. After hours, please call ELink [phone].

## 2023-07-15 NOTE — Progress Notes (Signed)
PT Cancellation Note  Patient Details Name: Antonio [name]. MRN: [id number] DOB: 10/10/1940   Cancelled Treatment:    Reason Eval/Treat Not Completed: Patient not medically ready; per RN pt not appropriate today.  Will follow up for medical readiness and based on goals of care.    Elray Mcgregor 07/14/2023, 9:07 AM Sheran Lawless, PT Acute Rehabilitation Services Office:[phone] 07/08/2023

## 2023-07-15 NOTE — Progress Notes (Signed)
eLink Physician-Brief Progress Note Patient Name: Antonio [name]. DOB: [birth year] MRN: [id number]   Date of Service  07/04/2023  HPI/Events of Note  Patient with ongoing back pain On oxycodone TID with good relief however experiencing similar pain overnight.  eICU Interventions  Oxycodone 5 mg ordered x 1     Intervention Category Intermediate Interventions: Pain - evaluation and management  Nykira Reddix Mechele Collin 07/04/2023, 3:55 AM

## 2023-07-15 NOTE — Progress Notes (Addendum)
This chaplain responded to PMT NP-Michelle consult for EOL spiritual care. The Pt. is resting comfortably at the time of the visit, surrounded by multiple generations of family. The chaplain understands time with family is the center of the Pt. life.  The chaplain understands the Pt. faith guided the Pt. love of farming and daily interactions with his neighbors. The chaplain listened reflectively to family stories and treasured memories. The chaplain affirmed the memories as an important part of the Pt. legacy.  The chaplain will revisit for prayer when the remainder of family arrives at the bedside.  **1630 This chaplain received an update from RN-[place]. Family is waiting for family to arrive at the bedside. RN will page spiritual care at the appropriate time for visit-[phone]  Chaplain Stephanie Acre [phone]

## 2023-07-15 NOTE — Progress Notes (Signed)
Chaplain responded to request from Nurse [facility] to offer spiritual care for the family whose loved one had just died.  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  Chaplain inquired if pt had been well-known in the community. Wife responded their store was the hub of the community and they took care of the people in ways that were needed.  Wife described a wonderful shared life for which she was very grateful.    Antonio [name]

## 2023-07-15 NOTE — Progress Notes (Signed)
OT Cancellation Note  Patient Details Name: Antonio [name]. MRN: [id number] DOB: July 04, 1940   Cancelled Treatment:    Reason Eval/Treat Not Completed: Patient not medically ready Per RN, pt not appropriate today and potential GOC meeting. Will check back later.   Tyler Deis, OTR/L [facility] Acute Rehabilitation Office: [phone]   Myrla Halsted 07/07/2023, 10:19 AM

## 2023-07-15 NOTE — Death Summary Note (Signed)
 DEATH SUMMARY   Patient Details  Name: Antonio [name]. MRN: [id number] DOB: 11-May-1941  Admission/Discharge Information   Admit Date:  2023/07/03  Date of Death: Date of Death: Jul 06, 2023  Time of Death: Time of Death: 11-Jul-1813  Length of Stay: 2  Referring Physician: Barbie Banner, MD   Reason(s) for Hospitalization  Septic shock due to multifocal right-sided pneumonia Loculated pleural effusion, studies consistent with transudative effusion Acute septic encephalopathy Acute respiratory failure with hypoxia Paroxysmal A-fib AKI on CKD stage IV Hyperkalemia Acute metabolic acidosis Hyperphosphatemia Anemia of critical illness  Diagnoses  Preliminary cause of death: Withdrawal of care in the setting of multisystem organ failure Secondary Diagnoses (including complications and co-morbidities):  Principal Problem:   Acute hypoxic respiratory failure (HCC) Active Problems:   PAF (paroxysmal atrial fibrillation) (HCC)   Pleural effusion   Septic shock (HCC)   AKI (acute kidney injury) (HCC)   Pneumonia due to infectious organism   Palliative care by specialist   Goals of care, counseling/discussion   Brief Hospital Course (including significant findings, care, treatment, and services provided and events leading to death)  Antonio [name]. is a 83 y.o. year old male who presented to the emergency room On 2023/07/03 with chief complaint of generalized chest pressure radiating to shoulders and back, no nausea vomiting chills fever or abdominal pain has had chronic issue with anorexia.  Seen in the emergency room placed on supplemental oxygen initially afebrile tachycardic, A-fib with RVR profoundly weak initial white blood cell count 15.2 respiratory viral panel was negative, creatinine 2.42 up from baseline of around 2 HCO3 19 glucose 104 INR 1.6, lactate 1.7 procalcitonin 0.14, initial troponin 8, BNP 267.  ECG showed sinus tachycardia with first-degree heart block and multiple PVCs, CT  of chest pelvis and abdomen showed a large partially loculated right pleural effusion with extensive right middle lobe and right lower lobe consolidation there was a small free-flowing left effusion had some mild mediastinal adenopathy abdomen was negative he did have a mildly enlarged prostate.  CT of head was negative. He was placed on supplemental oxygen, placed on broad-spectrum antibiotics for loculated effusion and right lower lobe middle lobe consolidation Patient started getting hypotensive, he was transferred to ICU, requiring vasopressor support, serum creatinine continued to get worse.  Chest tube was placed.  As patient with worsening serum creatinine, close to being started on dialysis with metabolic acidosis, goals of care discussions carried, patient's family decided to proceed with comfort care.  Comfort care was started and patient passed on 07-06-23 at 6:15 PM.  Patient's family was at bedside   Pertinent Labs and Studies  Significant Diagnostic Studies DG CHEST PORT 1 VIEW Result Date: July 06, 2023 CLINICAL DATA:  Follow-up pleural effusion EXAM: PORTABLE CHEST 1 VIEW COMPARISON:  06/26/2023 FINDINGS: Cardiac shadow remains enlarged. Gastric catheter extends into the stomach. Pigtail catheter is again noted on the right. Hydropneumothorax is noted stable in appearance from the prior exam. Associated lower lobe collapse is noted on the right. Left lung is clear. IMPRESSION: Stable appearance of the chest when compared with the previous day. Electronically Signed   By: Alcide Clever M.D.   On: 07/06/23 10:16   DG Abd Portable 1V Result Date: 06/26/2023 CLINICAL DATA:  Feeding tube placement. EXAM: PORTABLE ABDOMEN - 1 VIEW COMPARISON:  None Available. FINDINGS: Tip of the weighted enteric tube in the right upper quadrant in the region of the distal stomach or proximal duodenum. Increased air throughout nondilated bowel in  the left abdomen. IMPRESSION: Tip of the weighted enteric tube in  the right upper quadrant in the region of the distal stomach or proximal duodenum. Electronically Signed   By: Narda Rutherford M.D.   On: 06/26/2023 16:40   DG CHEST PORT 1 VIEW Result Date: 06/26/2023 CLINICAL DATA:  Chest tube. EXAM: PORTABLE CHEST 1 VIEW COMPARISON:  06/24/2023 and CT chest 06/24/2023. FINDINGS: Trachea is midline. Heart size stable. Interval placement of a pigtail drain in the right hemithorax with clearing of some of the previously seen large right pleural effusion. Air is not seen in the pleural cavity. Collapse/consolidation in perihilar right lung and right lung base. Minimal collapse/consolidation in the left lower lobe. Costophrenic angle is not included in the imaged. IMPRESSION: 1. Moderate to large right hydropneumothorax with new percutaneous drain in place. Overall amount of pleural fluid has decreased in the interval. 2. Bibasilar collapse/consolidation, right greater than left, possibly due to atelectasis. Difficult to exclude pneumonia. Electronically Signed   By: Leanna Battles M.D.   On: 06/26/2023 14:34   ECHOCARDIOGRAM COMPLETE Result Date: 06/25/2023    ECHOCARDIOGRAM REPORT   Patient Name:   Antonio [name]. Date of Exam: 06/25/2023 Medical Rec #:  [id number]          Height:       67.0 in Accession #:    [id number]         Weight:       185.0 lb Date of Birth:  [birth year]          BSA:          1.956 m Patient Age:    83 years           BP:           95/63 mmHg Patient Gender: M                  HR:           118 bpm. Exam Location:  Inpatient Procedure: 2D Echo, Color Doppler and Cardiac Doppler STAT ECHO Indications:    Dyspnea  History:        Patient has no prior history of Echocardiogram examinations.                 CAD, Stroke, Arrythmias:Tachycardia and Atrial Fibrillation;                 Risk Factors:Dyslipidemia, Hypertension and Former Smoker.                 Chronic Kidney Disease.  Sonographer:    Raeford Razor RDCS Referring Phys: [id number] ERIK C HOFFMAN   Sonographer Comments: Image acquisition challenging due to respiratory motion. IMPRESSIONS  1. Challenging study in the setting of tachycardia.  2. Left ventricular ejection fraction, by estimation, is 55%. The left ventricle has normal function. Left ventricular endocardial border not optimally defined to evaluate regional wall motion. There is mild left ventricular hypertrophy. Indeterminate diastolic filling due to E-A fusion.  3. Right ventricular systolic function is normal. The right ventricular size is normal.  4. Left atrial size was mild to moderately dilated.  5. Right atrial size was mildly dilated.  6. A small pericardial effusion is present. The pericardial effusion is circumferential.  7. The mitral valve is normal in structure. Mild to moderate mitral valve regurgitation. No evidence of mitral stenosis.  8. The aortic valve is tricuspid. There is moderate calcification of the aortic valve. Aortic valve regurgitation is  not visualized. No aortic stenosis is present.  9. Aortic dilatation noted. There is borderline dilatation of the aortic root, measuring 38 mm. There is borderline dilatation of the ascending aorta, measuring 37 mm. Comparison(s): No prior Echocardiogram. FINDINGS  Left Ventricle: Left ventricular ejection fraction, by estimation, is 55%. The left ventricle has normal function. Left ventricular endocardial border not optimally defined to evaluate regional wall motion. The left ventricular internal cavity size was normal in size. There is mild left ventricular hypertrophy. Indeterminate diastolic filling due to E-A fusion. Right Ventricle: The right ventricular size is normal. No increase in right ventricular wall thickness. Right ventricular systolic function is normal. Left Atrium: Left atrial size was mild to moderately dilated. Right Atrium: Right atrial size was mildly dilated. Pericardium: A small pericardial effusion is present. The pericardial effusion is circumferential. Mitral  Valve: The mitral valve is normal in structure. Mild to moderate mitral annular calcification. Mild to moderate mitral valve regurgitation. No evidence of mitral valve stenosis. Tricuspid Valve: The tricuspid valve is normal in structure. Tricuspid valve regurgitation is mild . No evidence of tricuspid stenosis. Aortic Valve: The aortic valve is tricuspid. There is moderate calcification of the aortic valve. Aortic valve regurgitation is not visualized. No aortic stenosis is present. Aortic valve peak gradient measures 6.2 mmHg. Pulmonic Valve: The pulmonic valve was not well visualized. Pulmonic valve regurgitation is trivial. No evidence of pulmonic stenosis. Aorta: Aortic dilatation noted. There is borderline dilatation of the aortic root, measuring 38 mm. There is borderline dilatation of the ascending aorta, measuring 37 mm. Venous: The inferior vena cava was not well visualized. IAS/Shunts: The interatrial septum was not well visualized.  LEFT VENTRICLE PLAX 2D LVIDd:         4.20 cm LVIDs:         2.80 cm LV PW:         1.30 cm LV IVS:        1.30 cm LVOT diam:     2.30 cm LVOT Area:     4.15 cm  RIGHT VENTRICLE RV Basal diam:  3.50 cm RV Mid diam:    2.90 cm RV S prime:     10.34 cm/s TAPSE (M-mode): 1.3 cm LEFT ATRIUM              Index        RIGHT ATRIUM           Index LA diam:        4.20 cm  2.15 cm/m   RA Area:     21.80 cm LA Vol (A2C):   112.0 ml 57.25 ml/m  RA Volume:   65.00 ml  33.23 ml/m LA Vol (A4C):   66.7 ml  34.09 ml/m LA Biplane Vol: 86.1 ml  44.01 ml/m  AORTIC VALVE AV Area (Vmax): 2.28 cm AV Vmax:        125.00 cm/s AV Peak Grad:   6.2 mmHg LVOT Vmax:      68.65 cm/s  AORTA Ao Root diam: 3.80 cm Ao Asc diam:  3.70 cm MR Peak grad:    56.0 mmHg    TRICUSPID VALVE MR Mean grad:    32.0 mmHg    TR Peak grad:   27.5 mmHg MR Vmax:         374.00 cm/s  TR Vmax:        262.00 cm/s MR Vmean:        260.0 cm/s MR PISA:  1.01 cm     SHUNTS MR PISA Eff ROA: 11 mm       Systemic  Diam: 2.30 cm MR PISA Radius:  0.40 cm Vishnu Priya Mallipeddi Electronically signed by Winfield Rast Mallipeddi Signature Date/Time: 06/25/2023/3:19:45 PM    Final    US RENAL Result Date: 06/25/2023 CLINICAL DATA:  Acute kidney injury. EXAM: RENAL / URINARY TRACT ULTRASOUND COMPLETE COMPARISON:  11/21/2012 FINDINGS: Right Kidney: Renal measurements: 10.7 x 4.4 x 4.0 cm = volume: 97 mL. 4.8 cm simple cyst identified lower pole region. No hydronephrosis. Left Kidney: Renal measurements: 7.7 x 3.6 x 3.6 cm = volume: 51 mL. Exophytic 3.0 cm cyst in the lower pole region. No hydronephrosis. Bladder: Bladder is nondistended. Other: None. IMPRESSION: 1. No hydronephrosis. 2. Bilateral renal cysts. Electronically Signed   By: Kennith Center M.D.   On: 06/25/2023 08:26   CT CHEST ABDOMEN PELVIS WO CONTRAST Result Date: 06/24/2023 CLINICAL DATA:  Sepsis EXAM: CT CHEST, ABDOMEN AND PELVIS WITHOUT CONTRAST TECHNIQUE: Multidetector CT imaging of the chest, abdomen and pelvis was performed following the standard protocol without IV contrast. RADIATION DOSE REDUCTION: This exam was performed according to the departmental dose-optimization program which includes automated exposure control, adjustment of the mA and/or kV according to patient size and/or use of iterative reconstruction technique. COMPARISON:  06/24/2023 FINDINGS: CT CHEST FINDINGS Cardiovascular: Unenhanced imaging of the heart demonstrates cardiomegaly and trace pericardial effusion. Diffuse atherosclerosis of the coronary vasculature. Normal caliber of the thoracic aorta. Atherosclerosis of the aortic arch. Evaluation of the vascular lumen is limited without IV contrast. Mediastinum/Nodes: Borderline enlarged mediastinal lymph nodes, largest in the precarinal region measuring 10 mm. Thyroid, trachea, and esophagus are unremarkable. Lungs/Pleura: There is a large partially loculated right pleural effusion, with extensive consolidation most pronounced in the right  middle and right lower lobes. There is a small free-flowing left pleural effusion, with patchy left lower lobe and lingular airspace disease. No pneumothorax. Central airways are patent. Musculoskeletal: No acute or destructive bony abnormalities. Reconstructed images demonstrate no additional findings. CT ABDOMEN PELVIS FINDINGS Hepatobiliary: Cholecystectomy. Unremarkable unenhanced appearance of the liver. Pancreas: Unremarkable unenhanced appearance. Spleen: Unremarkable unenhanced appearance. Adrenals/Urinary Tract: Bilateral renal cortical atrophy. Bilateral renal cortical cysts do not require specific imaging follow-up. No urinary tract calculi or obstruction. The adrenals are unremarkable. Bladder is minimally distended, limiting its evaluation. Stomach/Bowel: No bowel obstruction or ileus. No bowel wall thickening or inflammatory change. Vascular/Lymphatic: Aortic atherosclerosis. No enlarged abdominal or pelvic lymph nodes. Reproductive: Mild enlargement of the prostate. Other: No free fluid or free intraperitoneal gas. There is a right inguinal hernia, with a small segment of small bowel extending into the proximal aspect of the hernia. No incarceration or obstruction. Musculoskeletal: No acute or destructive bony abnormalities. Reconstructed images demonstrate no additional findings. IMPRESSION: 1. Large partially loculated right pleural effusion, with extensive right middle and right lower lobe consolidation. This could reflect pneumonia or atelectasis. 2. Small free-flowing left pleural effusion, with minimal left basilar airspace disease. 3. Borderline enlarged mediastinal adenopathy, likely reactive. 4. Cardiomegaly, with trace pericardial effusion. 5. No acute intra-abdominal or intrapelvic process. 6. Right inguinal hernia, with a short segment of small bowel protruding through the proximal aspect of the hernia. No incarceration or obstruction. 7. Mild enlargement the prostate. 8. Aortic  Atherosclerosis (ICD10-I70.0). Coronary artery atherosclerosis. Electronically Signed   By: Sharlet Salina M.D.   On: 06/24/2023 22:37   CT Head Wo Contrast Result Date: 06/24/2023 CLINICAL DATA:  Headache, new onset (Age >=  51y) EXAM: CT HEAD WITHOUT CONTRAST TECHNIQUE: Contiguous axial images were obtained from the base of the skull through the vertex without intravenous contrast. RADIATION DOSE REDUCTION: This exam was performed according to the departmental dose-optimization program which includes automated exposure control, adjustment of the mA and/or kV according to patient size and/or use of iterative reconstruction technique. COMPARISON:  12/10/2007 FINDINGS: Brain: There is atrophy and chronic small vessel disease changes. No acute intracranial abnormality. Specifically, no hemorrhage, hydrocephalus, mass lesion, acute infarction, or significant intracranial injury. Vascular: No hyperdense vessel or unexpected calcification. Skull: No acute calvarial abnormality. Sinuses/Orbits: No acute findings Other: None IMPRESSION: Atrophy, chronic microvascular disease. No acute intracranial abnormality. Electronically Signed   By: Charlett Nose M.D.   On: 06/24/2023 18:08   DG Chest 1 View Result Date: 06/24/2023 CLINICAL DATA:  Chest pain EXAM: CHEST  1 VIEW COMPARISON:  08/01/2013 FINDINGS: Large right pleural effusion. Bibasilar opacities, right greater than left could reflect atelectasis or infiltrates. Cardiomegaly, vascular congestion. No acute bony abnormality. IMPRESSION: Large right pleural effusion.  Bibasilar atelectasis or infiltrates. Cardiomegaly, vascular congestion. Electronically Signed   By: Charlett Nose M.D.   On: 06/24/2023 17:31    Microbiology Recent Results (from the past 240 hours)  Resp panel by RT-PCR (RSV, Flu A&B, Covid) Anterior Nasal Swab     Status: None   Collection Time: 06/24/23  4:37 PM   Specimen: Anterior Nasal Swab  Result Value Ref Range Status   SARS Coronavirus 2  by RT PCR NEGATIVE NEGATIVE Final   Influenza A by PCR NEGATIVE NEGATIVE Final   Influenza B by PCR NEGATIVE NEGATIVE Final    Comment: (NOTE) The Xpert Xpress SARS-CoV-2/FLU/RSV plus assay is intended as an aid in the diagnosis of influenza from Nasopharyngeal swab specimens and should not be used as a sole basis for treatment. Nasal washings and aspirates are unacceptable for Xpert Xpress SARS-CoV-2/FLU/RSV testing.  Fact Sheet for Patients: BloggerCourse.com  Fact Sheet for Healthcare Providers: SeriousBroker.it  This test is not yet approved or cleared by the [place] FDA and has been authorized for detection and/or diagnosis of SARS-CoV-2 by FDA under an Emergency Use Authorization (EUA). This EUA will remain in effect (meaning this test can be used) for the duration of the COVID-19 declaration under Section 564(b)(1) of the Act, 21 U.S.C. section 360bbb-3(b)(1), unless the authorization is terminated or revoked.     Resp Syncytial Virus by PCR NEGATIVE NEGATIVE Final    Comment: (NOTE) Fact Sheet for Patients: BloggerCourse.com  Fact Sheet for Healthcare Providers: SeriousBroker.it  This test is not yet approved or cleared by the [place] FDA and has been authorized for detection and/or diagnosis of SARS-CoV-2 by FDA under an Emergency Use Authorization (EUA). This EUA will remain in effect (meaning this test can be used) for the duration of the COVID-19 declaration under Section 564(b)(1) of the Act, 21 U.S.C. section 360bbb-3(b)(1), unless the authorization is terminated or revoked.  Performed at [facility], [address], [place], [place] [address]   Blood Culture (routine x 2)     Status: None (Preliminary result)   Collection Time: 06/24/23  4:55 PM   Specimen: BLOOD  Result Value Ref Range Status   Specimen Description BLOOD BLOOD RIGHT ARM   Final   Special Requests   Final    BOTTLES DRAWN AEROBIC AND ANAEROBIC Blood Culture results may not be optimal due to an inadequate volume of blood received in culture bottles   Culture  Final    NO GROWTH 4 DAYS Performed at [facility], [address] [place], [place] [address]    Report Status PENDING  Incomplete  Blood Culture (routine x 2)     Status: None (Preliminary result)   Collection Time: 06/24/23  5:00 PM   Specimen: BLOOD  Result Value Ref Range Status   Specimen Description BLOOD BLOOD LEFT ARM  Final   Special Requests   Final    BOTTLES DRAWN AEROBIC AND ANAEROBIC Blood Culture results may not be optimal due to an inadequate volume of blood received in culture bottles   Culture   Final    NO GROWTH 4 DAYS Performed at [facility], [address], [place], [place] [address]    Report Status PENDING  Incomplete  Body fluid culture w Gram Stain     Status: None (Preliminary result)   Collection Time: 06/26/23 12:31 PM   Specimen: Pleural Fluid  Result Value Ref Range Status   Specimen Description PLEURAL FLUID  Final   Special Requests NONE  Final   Gram Stain NO WBC SEEN NO ORGANISMS SEEN   Final   Culture   Final    NO GROWTH < 24 HOURS Performed at [facility], [address] [place], [place] [address]    Report Status PENDING  Incomplete  MRSA Next Gen by PCR, Nasal     Status: None   Collection Time: 06/26/23  6:23 PM   Specimen: Nasal Mucosa; Nasal Swab  Result Value Ref Range Status   MRSA by PCR Next Gen NOT DETECTED NOT DETECTED Final    Comment: (NOTE) The GeneXpert MRSA Assay (FDA approved for NASAL specimens only), is one component of a comprehensive MRSA colonization surveillance program. It is not intended to diagnose MRSA infection nor to guide or monitor treatment for MRSA infections. Test performance is not FDA approved in patients less than 69 years old. Performed at [facility], [address],  [place], [place] [address]     Lab Basic Metabolic Panel: Recent Labs  Lab 06/24/23 1638 06/24/23 2241 06/25/23 0346 06/25/23 1130 06/26/23 0540 07/02/2023 0454 07/01/2023 0721  NA 137 138 136 135 138  --  136  K 4.6 4.8 5.4* 5.3* 5.9*  --  5.2*  CL 107  --  106 106 101  --  105  CO2 19*  --  20* 16* 17*  --  18*  GLUCOSE 104*  --  106* 92 110*  --  111*  BUN 59*  --  59* 61* 63*  --  77*  CREATININE 2.42*  --  2.63* 2.88* 3.44*  --  3.58*  CALCIUM 8.4*  --  8.1* 8.2* 9.1  --  8.2*  MG  --   --   --   --  2.6* 2.9*  --   PHOS  --   --   --   --  6.4* 5.8*  --    Liver Function Tests: Recent Labs  Lab 06/24/23 1638 06/26/23 0540  AST 13*  --   ALT 11  --   ALKPHOS 62  --   BILITOT 0.6  --   PROT 6.8  --   ALBUMIN 2.4* 2.2*   Recent Labs  Lab 06/25/23 0346  LIPASE 18   No results for input(s): "AMMONIA" in the last 168 hours. CBC: Recent Labs  Lab 06/24/23 1636 06/24/23 2230 06/24/23 2241 06/25/23 0346 06/26/23 0540 07/02/2023 2956  WBC 15.2* 21.0*  --  18.6* 20.6* 15.5*  NEUTROABS 13.5*  --   --  16.1* 18.4*  --   HGB 8.5* 7.9* 8.2* 7.4* 7.4* 7.3*  HCT 28.9* 26.2* 24.0* 25.3* 25.9* 24.9*  MCV 82.1 82.4  --  83.5 84.4 82.7  PLT 426* 358  --  329 362 350   Cardiac Enzymes: No results for input(s): "CKTOTAL", "CKMB", "CKMBINDEX", "TROPONINI" in the last 168 hours. Sepsis Labs: Recent Labs  Lab 06/24/23 1906 06/24/23 2126 06/24/23 2230 06/25/23 0038 06/25/23 0346 06/25/23 2233 06/26/23 0540 06/26/23 1950 06/21/2023 0454 06/25/2023 0728  PROCALCITON 0.14  --   --   --   --   --   --   --   --   --   WBC  --   --  21.0*  --  18.6*  --  20.6*  --   --  15.5*  LATICACIDVEN  --    < >  --    < >  --  1.7 3.7* 1.3 1.2  --    < > = values in this interval not displayed.    Procedures/Operations     [employer] 06/28/2023, 9:20 AM

## 2023-07-15 NOTE — Progress Notes (Signed)
 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     The Palliative Medicine team has been asked to get involved to further support goals of care conversations.  Today's Discussion Jul 10, 2023  *Please note that this is a verbal dictation therefore any spelling or grammatical errors are due to the "[employer]" system interpretation.  Chart reviewed inclusive of vital signs, progress notes, laboratory results, and diagnostic images.    I met with patients  Antonio [name] this morning at bedside. He is joined by his niece who is a Designer, jewellery. She shares that he had an extremely restless night. She notes that he received some ativan this morning and it has settled him.  Patient RN, Thea Silversmith who shares that Azarion has had very poor urine output overnight. He had < . We reviewed that his pressor needs have slowly increased as well.   I spoke to patient niece about my concerns in the setting of patients present health. I shared my worry that we may very well be encroaching end of life. Patients niece understands and shares the goal(s) of the family is for the patient to be comfortable.  I shared that I would come back later this morning to speak with patients spouse and daughter.  _________________________________ Addendum:  I met with patients daughter and wife this afternoon with patients Rn. Antonio [name] and APP, Ephriam Knuckles present. We discussed patients current illness and how he is in organ failure. Patients family had already determined prior to arrival that they would like for him to be made comfortable.   We talked about transition to comfort measures in  house and what that would entail inclusive of medications to control pain, dyspnea, agitation, nausea, itching, and hiccups.  We discussed stopping all uneccessary measures such as cardiac monitoring, blood draws, needle sticks, and frequent vital signs.   Utilized reflective listening throughout our time together.   Patients family encouraged to pray and spent time with Antonio [name] during this phased of his life.  Questions and concerns addressed/Palliative Support Provided.   Objective Assessment: Vital Signs Vitals:   2023/07/10 0900 07-10-23 0915  BP: (!) 80/51 (!) 93/52  Pulse: 99 99  Resp: (!) 0 (!) 0  Temp:    SpO2: 100% 100%    Intake/Output Summary (Last 24 hours) at Jul 10, 2023 1007 Last data filed at Jul 10, 2023 0900 Gross per 24 hour  Intake 3361.71 ml  Output 1390 ml  Net 1971.71 ml   Last Weight  Most recent update: 06/26/2023  2:08 PM    Weight  75.8 kg (167 lb 1.7 oz)            Gen:  Elderly Caucasian M in moderate distress HEENT: coretrack,  dry mucous membranes CV: Irregular rate and rhythm  PULM:  On 4LPM [place], breathing is even and nonlabored ABD: soft/nontender  EXT: No edema  Neuro: Somnolent  SUMMARY OF RECOMMENDATIONS   DNAR/DNI  Family meeting held, plan for comfort measures  Fentanyl gtt ordered  Additional comfort medications per [facility]  Unrestricted visitation  Appreciate Chaplain support  PMT will remain involved  Time: 67 Billing based on MDM: High ______________________________________________________________________________________ Lamarr Lulas  Palliative Medicine Team Team Cell Phone: [phone] Please utilize secure chat  with additional questions, if there is no response within 30 minutes please call the above phone number  Palliative Medicine Team providers are available by phone from 7am to 7pm daily and can be reached through the team cell phone.  Should this patient require assistance outside of these hours,  please call the patient's attending physician.

## 2023-07-15 DEATH — deceased

## 2023-07-25 LAB — FUNGUS CULTURE WITH STAIN

## 2023-07-25 LAB — FUNGAL ORGANISM REFLEX

## 2023-07-25 LAB — FUNGUS CULTURE RESULT
